# Patient Record
Sex: Male | Born: 1970 | Race: White | Hispanic: No | Marital: Married | Smoking: Former smoker
Health system: Southern US, Community
[De-identification: ages and names within clinical notes are randomized; demographics above are authoritative.]

## PROBLEM LIST (undated history)

## (undated) DIAGNOSIS — M1990 Unspecified osteoarthritis, unspecified site: Secondary | ICD-10-CM

## (undated) DIAGNOSIS — K219 Gastro-esophageal reflux disease without esophagitis: Secondary | ICD-10-CM

## (undated) HISTORY — PX: HERNIA REPAIR: SHX51

## (undated) HISTORY — DX: Unspecified osteoarthritis, unspecified site: M19.90

## (undated) HISTORY — DX: Gastro-esophageal reflux disease without esophagitis: K21.9

---

## 1998-03-30 ENCOUNTER — Emergency Department (HOSPITAL_COMMUNITY): Admission: EM | Admit: 1998-03-30 | Discharge: 1998-03-30 | Payer: Self-pay | Admitting: Emergency Medicine

## 2003-09-01 ENCOUNTER — Emergency Department (HOSPITAL_COMMUNITY): Admission: EM | Admit: 2003-09-01 | Discharge: 2003-09-01 | Payer: Self-pay | Admitting: Emergency Medicine

## 2007-10-09 ENCOUNTER — Inpatient Hospital Stay (HOSPITAL_COMMUNITY): Admission: EM | Admit: 2007-10-09 | Discharge: 2007-10-09 | Payer: Self-pay | Admitting: Emergency Medicine

## 2010-05-05 ENCOUNTER — Emergency Department (HOSPITAL_COMMUNITY): Admission: EM | Admit: 2010-05-05 | Discharge: 2010-05-05 | Payer: Self-pay | Admitting: Emergency Medicine

## 2010-08-25 ENCOUNTER — Encounter: Admission: RE | Admit: 2010-08-25 | Discharge: 2010-08-25 | Payer: Self-pay | Admitting: Sports Medicine

## 2010-12-08 LAB — POCT I-STAT, CHEM 8
BUN: 6 mg/dL (ref 6–23)
Calcium, Ion: 1.05 mmol/L — ABNORMAL LOW (ref 1.12–1.32)
Chloride: 111 meq/L (ref 96–112)
Creatinine, Ser: 1 mg/dL (ref 0.4–1.5)
Glucose, Bld: 102 mg/dL — ABNORMAL HIGH (ref 70–99)
HCT: 45 % (ref 39.0–52.0)
Hemoglobin: 15.3 g/dL (ref 13.0–17.0)
Potassium: 3.9 meq/L (ref 3.5–5.1)
Sodium: 145 meq/L (ref 135–145)
TCO2: 22 mmol/L (ref 0–100)

## 2010-12-08 LAB — COMPREHENSIVE METABOLIC PANEL WITH GFR
ALT: 18 U/L (ref 0–53)
AST: 23 U/L (ref 0–37)
Albumin: 4.2 g/dL (ref 3.5–5.2)
Alkaline Phosphatase: 42 U/L (ref 39–117)
BUN: 6 mg/dL (ref 6–23)
CO2: 23 meq/L (ref 19–32)
Calcium: 9.2 mg/dL (ref 8.4–10.5)
Chloride: 112 meq/L (ref 96–112)
Creatinine, Ser: 0.81 mg/dL (ref 0.4–1.5)
GFR calc Af Amer: >60 mL/min (ref >60)
GFR calc non Af Amer: >60 mL/min (ref >60)
Glucose, Bld: 104 mg/dL — ABNORMAL HIGH (ref 70–99)
Potassium: 3.7 meq/L (ref 3.5–5.1)
Sodium: 144 meq/L (ref 135–145)
Total Bilirubin: 0.4 mg/dL (ref 0.3–1.2)
Total Protein: 7.4 g/dL (ref 6.0–8.3)

## 2010-12-08 LAB — APTT: aPTT: 26 s (ref 24–37)

## 2010-12-08 LAB — PROTIME-INR
INR: 0.96 (ref 0.00–1.49)
Prothrombin Time: 13 s (ref 11.6–15.2)

## 2010-12-08 LAB — ABO/RH: ABO/RH(D): O POS

## 2010-12-08 LAB — CBC
HCT: 42.8 % (ref 39.0–52.0)
Hemoglobin: 14.9 g/dL (ref 13.0–17.0)
MCH: 33.3 pg (ref 26.0–34.0)
MCHC: 34.8 g/dL (ref 30.0–36.0)
MCV: 95.7 fL (ref 78.0–100.0)
Platelets: 191 K/uL (ref 150–400)
RBC: 4.47 MIL/uL (ref 4.22–5.81)
RDW: 12.8 % (ref 11.5–15.5)
WBC: 8.1 K/uL (ref 4.0–10.5)

## 2010-12-08 LAB — TYPE AND SCREEN
ABO/RH(D): O POS
Antibody Screen: NEGATIVE

## 2010-12-08 LAB — LACTIC ACID, PLASMA: Lactic Acid, Venous: 1.6 mmol/L (ref 0.5–2.2)

## 2010-12-08 LAB — ETHANOL: Alcohol, Ethyl (B): 229 mg/dL — ABNORMAL HIGH (ref 0–10)

## 2011-02-06 NOTE — Consult Note (Signed)
[name], [name]          ACCOUNT NO.:  [id number]   MEDICAL RECORD NO.:  [id number]          PATIENT TYPE:  INP   LOCATION:  3304                         FACILITY:  [facility]   PHYSICIAN:  Antonietta Breach, M.D.  DATE OF BIRTH:  04/09/1971   DATE OF CONSULTATION:  10/09/2007  DATE OF DISCHARGE:  10/09/2007                                 CONSULTATION   REQUESTING PHYSICIAN:  [facility].   REASON FOR CONSULTATION:  Substance abuse overdose.   [name] is a 40 year old male admitted to the [facility] on  October 08, 2007, after a drinking binge and an overdose.   The patient has been laid off from work.  He has been drinking alcohol  heavily.  The patient drank all day on the day prior to admission and  has a complete blackout for the events that followed, including when he  swallowed a full bottle of 90 tablets of Xanax 0.5 mg, the bottle had  just been filled on October 02, 2007.   The patient did have severe delirium during his intoxication and was  combative in the emergency room.   By the day of the undersigned's evaluation, the patient is calm.  He is  socially cooperative.  He is not having any thoughts of harming himself  or others.  He has no delusions or hallucinations.  He has constructive  future goals and intact interests.  He has appropriate level of regret  about his alcohol binge.  He realizes that the alcohol impaired his  judgment and increased his impulsivity.   PAST PSYCHIATRIC HISTORY:  The patient does have a history of alcohol  dependence and has undergone a 28-day Chemical Dependency Inpatient  Rehabilitation Program at [facility].   He has been prescribed Xanax for anxiety as an outpatient.   FAMILY PSYCHIATRIC HISTORY:  None known.   SOCIAL HISTORY:  He was recently laid off.  He has a girlfriend and they  live together.   MENTAL STATUS EXAM:  [name] is alert, oriented to all spheres.  Speech within normal  limits.  Affect broad and appropriate.  Thought  process logical, coherent, and goal-directed.  No looseness of  associations.  Thought content, no thoughts of harming himself, no  thoughts of harming others, no delusions, no hallucinations.  Judgment  is intact.,   ASSESSMENT:  AXIS I:  1. Alcohol dependence.  2. Adjustment disorder with mixed disturbance of emotions and conduct      resolved after recovery from substance intoxication.  3. 293.00, delirium due to substance intoxication, resolved.  AXIS II:  Deferred.  AXIS III:  History of seizure disorder, on Tegretol.  AXIS IV:  Occupational.  AXIS V:  55.   [name] is no longer at risk to harm himself or others now that he  has recovered from his intoxication.   He agrees to call emergency services immediately for any thoughts of  harming himself, thoughts of harming others, or distress.   The undersigned did recommend a Chemical Dependency Inpatient  Rehabilitation Program; however, the patient declined and he is no  longer committable  now that he has recovered from his intoxicated state.   The patient wants to proceed with outpatient.   There are Chemical Dependency Inpatient Rehabilitation Programs  available at [facility], [facility], and [facility].  Also, there is Alcohol Drug Services through the [facility].  The patient needs to also attend 12-step groups.      Antonietta Breach, M.D.  Electronically Signed     JW/MEDQ  D:  02/07/2008  T:  02/07/2008  Job:  [id number]

## 2011-02-06 NOTE — H&P (Signed)
NAMEKAI, CALICO          ACCOUNT NO.:  [id number]   MEDICAL RECORD NO.:  [id number]          PATIENT TYPE:  INP   LOCATION:  2101                         FACILITY:  [facility]   PHYSICIAN:  Mobolaji B. Bakare, M.D.DATE OF BIRTH:  [birth year]   DATE OF ADMISSION:  10/08/2007  DATE OF DISCHARGE:                              HISTORY & PHYSICAL   PRIMARY CARE PHYSICIAN:  Dr. Aida Puffer   CHIEF COMPLAINT:  Overdose and alcohol intoxication.   HISTORY OF PRESENT ILLNESS:  Mr. Wildes is a 40 year old Caucasian male  who has been undergoing some stressors recently.  He was laid off from  his job at [employer].  Since then, the patient has been drinking  heavily.  He was accompanied to the emergency room by his girlfriend.  Both of them live together.  The patient states that the patient had  been drinking all day yesterday, and about 6:30 p.m. he swallowed a full  bottle of Xanax 0.5 mg, 90 tablets, which was filled on October 02, 2007.  The patient was brought to the emergency room around 8:50 last night.  In route to the hospital, the patient was very combative.  EMS had to  give him Versed 6 mg in total.  Upon arrival, the patient was calmer.  He had a blood pressure of 96/73, pulse 103, respiratory rate 18.  He  was arousable.  He was given Romazicon by ER.  He woke up after the  second does but went back to sleep.  His alcohol level was 211.  The  patient was currently unable to give any history.   REVIEW OF SYSTEMS:  Unobtainable.   PAST MEDICAL HISTORY:  1. Seizure disorder.  2. Depression.   CURRENT MEDICATIONS:  1. Tegretol.  2. Xanax.   ALLERGIES:  No known drug allergies.   FAMILY HISTORY:  Father is alive.  He has diabetes mellitus and  hypertension.   SOCIAL HISTORY:  The patient is unemployed, recently laid off.  He  smokes about a pack a day and drinks alcohol heavy.   PHYSICAL EXAMINATION:  VITAL SIGNS:  Currently, blood pressure 105/70,  pulse 90,  respirations 18, O2 saturation 100% on oxygen.  NECK:  The patient is arousable minimally to sternal rub.  His pupils  are small, 2-3 mm, nonreactive.  He is not in any acute respiratory  distress.  He is snoring.  No elevated JVD.  No carotid bruits.  LUNGS:  Good air entry bilaterally.  CV:  S1, S2 regular.  No murmur.  ABDOMEN:  Soft, nontender, bowel sounds present.  EXTREMITIES:  No pedal edema, calf tenderness.  CNS:  The patient  moves all limbs to sternal rub.   LABORATORY DATA:  Initially, urine drug screen positive for  benzodiazepine.  Salicylate level less than 4.  Lipase 22, alcohol level  211.  Sodium 114, potassium 4.2, chloride 106, bicarbonate 29, glucose  88, BUN 88.  Creatinine 0.94, LFTs normal except for ALT 57.  Acetaminophen level less than 10.  PT 13.6, INR 1.6, CBC is normal.  Urinalysis is normal.  Chest x-ray showed no acute findings.  EKG sinus  tachycardia with heart rate of 105.   ASSESSMENT/PLAN:  1. Suicidal attempt.  2. Overdose on Xanax with altered mental status.  3. Altered mental status secondary to #2 and alcohol intoxication.  4. Alcohol intoxication.  5. Major depression.  6. Tobacco abuse.  7. History of seizures.   PLAN:  Admit to ICU for close monitoring, 24-hour sitter when awake,  thiamine 100 mg IV daily.  Folate 1 mg IV daily.  IV fluid normal saline  at 125 cc per hour.  The patient will be kept n.p.o.  He will be placed  on seizure precautions.  Psychiatry is to be consulted.  We will need to  resume Tegretol when the patient is awake.  Will do a urinary tract q.2  h.  If there is no improvement in his mental status in the next 6 hours,  we will obtain a head CT scan.      Mobolaji B. Corky Downs, M.D.  Electronically Signed     MBB/MEDQ  D:  10/09/2007  T:  10/09/2007  Job:  [id number]   cc:   Aida Puffer

## 2011-05-23 ENCOUNTER — Encounter (INDEPENDENT_AMBULATORY_CARE_PROVIDER_SITE_OTHER): Payer: Self-pay | Admitting: Surgery

## 2011-06-05 ENCOUNTER — Encounter (INDEPENDENT_AMBULATORY_CARE_PROVIDER_SITE_OTHER): Payer: Self-pay | Admitting: Surgery

## 2011-06-05 ENCOUNTER — Ambulatory Visit (INDEPENDENT_AMBULATORY_CARE_PROVIDER_SITE_OTHER): Payer: BC Managed Care – PPO | Admitting: Surgery

## 2011-06-05 VITALS — BP 112/74 | HR 72 | Temp 97.4°F | Ht 69.0 in | Wt 176.8 lb

## 2011-06-05 DIAGNOSIS — K4090 Unilateral inguinal hernia, without obstruction or gangrene, not specified as recurrent: Secondary | ICD-10-CM | POA: Insufficient documentation

## 2011-06-05 MED ORDER — OXYCODONE-ACETAMINOPHEN 5-325 MG PO TABS: 1.0000 | ORAL_TABLET | ORAL | Status: AC | PRN
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
## 2011-06-14 ENCOUNTER — Encounter (INDEPENDENT_AMBULATORY_CARE_PROVIDER_SITE_OTHER): Payer: Self-pay | Admitting: Surgery

## 2011-06-14 LAB — RAPID URINE DRUG SCREEN, HOSP PERFORMED
Amphetamines: NOT DETECTED
Barbiturates: NOT DETECTED
Benzodiazepines: POSITIVE — AB
Cocaine: NOT DETECTED
Opiates: NOT DETECTED
Tetrahydrocannabinol: NOT DETECTED

## 2011-06-14 LAB — ACETAMINOPHEN LEVEL: Acetaminophen (Tylenol), Serum: <10 — ABNORMAL LOW

## 2011-06-14 LAB — URINALYSIS, ROUTINE W REFLEX MICROSCOPIC
Bilirubin Urine: NEGATIVE
Glucose, UA: NEGATIVE
Hgb urine dipstick: NEGATIVE
Ketones, ur: NEGATIVE
Nitrite: NEGATIVE
Protein, ur: NEGATIVE
Specific Gravity, Urine: 1.01
Urobilinogen, UA: 0.2
pH: 6

## 2011-06-14 LAB — COMPREHENSIVE METABOLIC PANEL
AST: 33
Albumin: 4.2
Alkaline Phosphatase: 77
Chloride: 106
GFR calc Af Amer: >60
Potassium: 4.2
Total Bilirubin: 0.5
Total Protein: 7.1

## 2011-06-14 LAB — ETHANOL: Alcohol, Ethyl (B): 211 — ABNORMAL HIGH

## 2011-06-14 LAB — DIFFERENTIAL
Basophils Absolute: 0
Basophils Relative: 1
Eosinophils Relative: 5
Lymphocytes Relative: 35
Monocytes Absolute: 0.5
Monocytes Relative: 8

## 2011-06-14 LAB — SALICYLATE LEVEL: Salicylate Lvl: <4

## 2011-06-14 LAB — CBC
Platelets: 232
WBC: 6.3

## 2011-06-14 NOTE — Progress Notes (Signed)
I received a note from Dr. Jeannetta Nap showing that the patient has been getting frequent prescriptions for narcotics from his office.  I gave him a prescription for Percocet at the time of his consultation, but I think it is a bad idea to have a patient receiving narcotics from two different physicians.  Therefore, we will not prescribe any pain medication until the time of surgery, and will allow Dr. Jeannetta Nap to manage his preop symptoms.

## 2011-07-11 ENCOUNTER — Telehealth (INDEPENDENT_AMBULATORY_CARE_PROVIDER_SITE_OTHER): Payer: Self-pay | Admitting: General Surgery

## 2011-07-11 NOTE — Telephone Encounter (Signed)
Pt called today and wanted more pain meds. Pt was seen back on 9-11 for [facility] and Rx for percocet 5/325 was given. Pt wants another Rx for Percocet. Pt is aware that it may be this afternoon before I get a answer from the Dr

## 2011-07-11 NOTE — Telephone Encounter (Signed)
Please see my previous note from 9/20.  The patient has been trying to get narcotics from two different physicians.  He may only get narcotics from Dr. Jeannetta Nap.  He needs to go ahead and schedule his surgery.

## 2011-07-11 NOTE — Telephone Encounter (Signed)
Called and spoke to pt wife and told that we will not be giving any pain med at this time. Per Dr Corliss Skains

## 2011-07-26 ENCOUNTER — Encounter (HOSPITAL_COMMUNITY): Payer: BC Managed Care – PPO

## 2011-07-26 ENCOUNTER — Encounter (HOSPITAL_COMMUNITY): Payer: Self-pay

## 2011-07-26 DIAGNOSIS — K4090 Unilateral inguinal hernia, without obstruction or gangrene, not specified as recurrent: Secondary | ICD-10-CM

## 2011-07-26 LAB — CBC
Hemoglobin: 12.6 g/dL — ABNORMAL LOW (ref 13.0–17.0)
MCH: 31.9 pg (ref 26.0–34.0)
Platelets: 200 10*3/uL (ref 150–400)
RBC: 3.95 MIL/uL — ABNORMAL LOW (ref 4.22–5.81)
WBC: 4.3 10*3/uL (ref 4.0–10.5)

## 2011-07-26 LAB — SURGICAL PCR SCREEN: MRSA, PCR: NEGATIVE

## 2011-07-26 NOTE — Pre-Procedure Instructions (Signed)
07/26/11 labs okay for surgery per annie at office per Dr Corliss Skains.
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
## 2011-07-26 NOTE — Progress Notes (Signed)
Quick Note:  This patient may proceed with surgery ______ 

## 2011-07-27 NOTE — Progress Notes (Unsigned)
This encounter was created in error - please disregard.

## 2011-07-29 NOTE — Progress Notes (Signed)
I don't know how to close this encounter

## 2011-08-06 ENCOUNTER — Encounter (HOSPITAL_COMMUNITY)
Admission: RE | Disposition: A | Discharge status: Home / Self Care | Payer: Self-pay | Source: Ambulatory Visit | Attending: Surgery

## 2011-08-06 ENCOUNTER — Encounter (HOSPITAL_COMMUNITY): Payer: Self-pay | Admitting: *Deleted

## 2011-08-06 ENCOUNTER — Ambulatory Visit (HOSPITAL_COMMUNITY)
Admission: RE | Admit: 2011-08-06 | Discharge: 2011-08-06 | Disposition: A | Discharge status: Home / Self Care | Payer: BC Managed Care – PPO | Source: Ambulatory Visit | Attending: Surgery | Admitting: Surgery

## 2011-08-06 ENCOUNTER — Ambulatory Visit (HOSPITAL_COMMUNITY): Payer: BC Managed Care – PPO | Admitting: Anesthesiology

## 2011-08-06 ENCOUNTER — Encounter (HOSPITAL_COMMUNITY): Payer: Self-pay | Admitting: Anesthesiology

## 2011-08-06 DIAGNOSIS — K4090 Unilateral inguinal hernia, without obstruction or gangrene, not specified as recurrent: Secondary | ICD-10-CM | POA: Insufficient documentation

## 2011-08-06 DIAGNOSIS — Z79899 Other long term (current) drug therapy: Secondary | ICD-10-CM | POA: Insufficient documentation

## 2011-08-06 DIAGNOSIS — F411 Generalized anxiety disorder: Secondary | ICD-10-CM | POA: Insufficient documentation

## 2011-08-06 DIAGNOSIS — K219 Gastro-esophageal reflux disease without esophagitis: Secondary | ICD-10-CM | POA: Insufficient documentation

## 2011-08-06 DIAGNOSIS — M129 Arthropathy, unspecified: Secondary | ICD-10-CM | POA: Insufficient documentation

## 2011-08-06 HISTORY — PX: INGUINAL HERNIA REPAIR: SHX194

## 2011-08-06 SURGERY — REPAIR, HERNIA, INGUINAL, ADULT
Anesthesia: General | Site: Pelvis | Laterality: Right | Wound class: Clean

## 2011-08-06 MED ORDER — DEXAMETHASONE SODIUM PHOSPHATE 10 MG/ML IJ SOLN
INTRAMUSCULAR | Status: DC | PRN
  Administered 2011-08-06: 10 mg via INTRAVENOUS

## 2011-08-06 MED ORDER — HYDROMORPHONE HCL PF 1 MG/ML IJ SOLN: 1.0000 mg | INTRAMUSCULAR | Status: DC | PRN

## 2011-08-06 MED ORDER — BUPIVACAINE-EPINEPHRINE 0.25% -1:200000 IJ SOLN
INTRAMUSCULAR | Status: AC
  Filled 2011-08-06: qty 1

## 2011-08-06 MED ORDER — BUPIVACAINE LIPOSOME 1.3 % IJ SUSP
20.0000 mL | Freq: Once | INTRAMUSCULAR | Status: AC
  Administered 2011-08-06: 20 mL
  Filled 2011-08-06: qty 20

## 2011-08-06 MED ORDER — FENTANYL CITRATE 0.05 MG/ML IJ SOLN
INTRAMUSCULAR | Status: AC
  Filled 2011-08-06: qty 2

## 2011-08-06 MED ORDER — FENTANYL CITRATE 0.05 MG/ML IJ SOLN
25.0000 ug | INTRAMUSCULAR | Status: DC | PRN
  Administered 2011-08-06: 50 ug via INTRAVENOUS

## 2011-08-06 MED ORDER — CEFAZOLIN SODIUM 1-5 GM-% IV SOLN
1.0000 g | Freq: Once | INTRAVENOUS | Status: AC
  Administered 2011-08-06: 1 g via INTRAVENOUS
  Filled 2011-08-06: qty 50

## 2011-08-06 MED ORDER — OXYCODONE-ACETAMINOPHEN 5-325 MG PO TABS
ORAL_TABLET | ORAL | Status: AC
  Administered 2011-08-06: 1 via ORAL
  Filled 2011-08-06: qty 1

## 2011-08-06 MED ORDER — ONDANSETRON HCL 4 MG/2ML IJ SOLN
INTRAMUSCULAR | Status: DC | PRN
  Administered 2011-08-06: 4 mg via INTRAVENOUS

## 2011-08-06 MED ORDER — HYDROMORPHONE HCL PF 2 MG/ML IJ SOLN
INTRAMUSCULAR | Status: AC
  Administered 2011-08-06: 1 mg via INTRAVENOUS
  Filled 2011-08-06: qty 1

## 2011-08-06 MED ORDER — KETOROLAC TROMETHAMINE 30 MG/ML IJ SOLN: 15.0000 mg | Freq: Once | INTRAMUSCULAR | Status: DC | PRN

## 2011-08-06 MED ORDER — FENTANYL CITRATE 0.05 MG/ML IJ SOLN
25.0000 ug | INTRAMUSCULAR | Status: DC | PRN
  Administered 2011-08-06 (×2): 50 ug via INTRAVENOUS

## 2011-08-06 MED ORDER — ONDANSETRON HCL 4 MG/2ML IJ SOLN: 4.0000 mg | INTRAMUSCULAR | Status: DC | PRN

## 2011-08-06 MED ORDER — OXYCODONE-ACETAMINOPHEN 5-325 MG PO TABS: 1.0000 | ORAL_TABLET | ORAL | Status: DC | PRN

## 2011-08-06 MED ORDER — PROPOFOL 10 MG/ML IV EMUL
INTRAVENOUS | Status: DC | PRN
  Administered 2011-08-06: 200 mg via INTRAVENOUS

## 2011-08-06 MED ORDER — FENTANYL CITRATE 0.05 MG/ML IJ SOLN
INTRAMUSCULAR | Status: DC | PRN
  Administered 2011-08-06: 100 ug via INTRAVENOUS
  Administered 2011-08-06 (×3): 25 ug via INTRAVENOUS
  Administered 2011-08-06: 50 ug via INTRAVENOUS
  Administered 2011-08-06: 25 ug via INTRAVENOUS

## 2011-08-06 MED ORDER — FENTANYL CITRATE 0.05 MG/ML IJ SOLN
INTRAMUSCULAR | Status: AC
  Administered 2011-08-06: 50 ug via INTRAVENOUS
  Filled 2011-08-06: qty 2

## 2011-08-06 MED ORDER — MEPERIDINE HCL 25 MG/ML IJ SOLN: 6.2500 mg | INTRAMUSCULAR | Status: DC | PRN

## 2011-08-06 MED ORDER — ACETAMINOPHEN 10 MG/ML IV SOLN
INTRAVENOUS | Status: AC
  Filled 2011-08-06: qty 100

## 2011-08-06 MED ORDER — SODIUM CHLORIDE 0.9 % IR SOLN
Status: DC | PRN
  Administered 2011-08-06: 1000 mL

## 2011-08-06 MED ORDER — ACETAMINOPHEN 10 MG/ML IV SOLN
INTRAVENOUS | Status: DC | PRN
  Administered 2011-08-06: 1000 mg via INTRAVENOUS

## 2011-08-06 MED ORDER — OXYCODONE-ACETAMINOPHEN 5-325 MG PO TABS: 1.0000 | ORAL_TABLET | ORAL | Status: AC | PRN

## 2011-08-06 MED ORDER — KETOROLAC TROMETHAMINE 60 MG/2ML IM SOLN
INTRAMUSCULAR | Status: DC | PRN
  Administered 2011-08-06: 30 mg via INTRAMUSCULAR

## 2011-08-06 MED ORDER — LACTATED RINGERS IV SOLN
INTRAVENOUS | Status: DC | PRN
  Administered 2011-08-06: 07:00:00 via INTRAVENOUS

## 2011-08-06 MED ORDER — MIDAZOLAM HCL 5 MG/5ML IJ SOLN
INTRAMUSCULAR | Status: DC | PRN
  Administered 2011-08-06: 2 mg via INTRAVENOUS

## 2011-08-06 SURGICAL SUPPLY — 46 items
APL SKNCLS STERI-STRIP NONHPOA (GAUZE/BANDAGES/DRESSINGS) ×1
BENZOIN TINCTURE PRP APPL 2/3 (GAUZE/BANDAGES/DRESSINGS) ×2 IMPLANT
BLADE HEX COATED 2.75 (ELECTRODE) ×2 IMPLANT
CHLORAPREP W/TINT 26ML (MISCELLANEOUS) ×2 IMPLANT
CLOSURE STERI STRIP 1/2 X4 (GAUZE/BANDAGES/DRESSINGS) ×1 IMPLANT
CLOTH BEACON ORANGE TIMEOUT ST (SAFETY) ×2 IMPLANT
COVER SURGICAL LIGHT HANDLE (MISCELLANEOUS) ×2 IMPLANT
DECANTER SPIKE VIAL GLASS SM (MISCELLANEOUS) ×2 IMPLANT
DISSECTOR ROUND CHERRY 3/8 STR (MISCELLANEOUS) ×2 IMPLANT
DRAIN PENROSE 18X1/2 LTX STRL (DRAIN) ×1 IMPLANT
DRAPE LAPAROTOMY TRNSV 102X78 (DRAPE) ×2 IMPLANT
DRAPE UTILITY XL STRL (DRAPES) ×2 IMPLANT
DRSG OPSITE 4X5.5 SM (GAUZE/BANDAGES/DRESSINGS) ×1 IMPLANT
DRSG TEGADERM 4X4.75 (GAUZE/BANDAGES/DRESSINGS) IMPLANT
ELECT REM PT RETURN 9FT ADLT (ELECTROSURGICAL) ×2
ELECTRODE REM PT RTRN 9FT ADLT (ELECTROSURGICAL) ×1 IMPLANT
GAUZE SPONGE 4X4 16PLY XRAY LF (GAUZE/BANDAGES/DRESSINGS) IMPLANT
GLOVE BIO SURGEON STRL SZ7 (GLOVE) ×2 IMPLANT
GLOVE BIOGEL PI IND STRL 7.5 (GLOVE) ×1 IMPLANT
GLOVE BIOGEL PI INDICATOR 7.5 (GLOVE) ×1
GOWN STRL NON-REIN LRG LVL3 (GOWN DISPOSABLE) ×4 IMPLANT
GOWN STRL REIN XL XLG (GOWN DISPOSABLE) ×2 IMPLANT
KIT BASIN OR (CUSTOM PROCEDURE TRAY) ×2 IMPLANT
MESH ULTRAPRO 3X6 7.6X15CM (Mesh General) ×1 IMPLANT
NDL HYPO 25X1 1.5 SAFETY (NEEDLE) ×1 IMPLANT
NEEDLE HYPO 25X1 1.5 SAFETY (NEEDLE) ×2 IMPLANT
NS IRRIG 1000ML POUR BTL (IV SOLUTION) ×2 IMPLANT
PACK GENERAL/GYN (CUSTOM PROCEDURE TRAY) ×2 IMPLANT
SPONGE GAUZE 4X4 12PLY (GAUZE/BANDAGES/DRESSINGS) ×2 IMPLANT
STRIP CLOSURE SKIN 1/2X4 (GAUZE/BANDAGES/DRESSINGS) ×2 IMPLANT
SUT MNCRL AB 4-0 PS2 18 (SUTURE) ×2 IMPLANT
SUT PROLENE 2 0 SH DA (SUTURE) ×2 IMPLANT
SUT SILK 2 0 SH (SUTURE) IMPLANT
SUT SILK 3 0 (SUTURE)
SUT SILK 3-0 18XBRD TIE 12 (SUTURE) IMPLANT
SUT VIC AB 2-0 CT2 27 (SUTURE) IMPLANT
SUT VIC AB 2-0 SH 27 (SUTURE) ×2
SUT VIC AB 2-0 SH 27X BRD (SUTURE) ×1 IMPLANT
SUT VIC AB 3-0 SH 27 (SUTURE) ×2
SUT VIC AB 3-0 SH 27X BRD (SUTURE) IMPLANT
SUT VIC AB 3-0 SH 27XBRD (SUTURE) IMPLANT
SUT VICRYL 0 27 CT2 27 ABS (SUTURE) IMPLANT
SUT VICRYL 0 UR6 27IN ABS (SUTURE) ×1 IMPLANT
SYR CONTROL 10ML LL (SYRINGE) ×2 IMPLANT
TAPE HYPAFIX 4 X10 (GAUZE/BANDAGES/DRESSINGS) ×1 IMPLANT
TOWEL OR 17X26 10 PK STRL BLUE (TOWEL DISPOSABLE) ×4 IMPLANT

## 2011-08-06 NOTE — Preoperative (Signed)
Beta Blockers   Reason not to administer Beta Blockers:Not Applicable 

## 2011-08-06 NOTE — Progress Notes (Signed)
Pt ambulated in hall to BR.  Pt tolerated well.  Pt was able to void moderate amount of clear yellow urine.
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
## 2011-08-06 NOTE — Anesthesia Preprocedure Evaluation (Signed)
Anesthesia Evaluation  Patient identified by MRN, date of birth, ID band Patient awake    Reviewed: Allergy & Precautions, H&P , NPO status , Patient's Chart, lab work & pertinent test results  Airway       Dental   Pulmonary neg pulmonary ROS, Current Smoker,          Cardiovascular neg cardio ROS     Neuro/Psych Negative Neurological ROS  Negative Psych ROS   GI/Hepatic negative GI ROS, Neg liver ROS,   Endo/Other  Negative Endocrine ROS  Renal/GU negative Renal ROS  Genitourinary negative   Musculoskeletal negative musculoskeletal ROS (+)   Abdominal   Peds negative pediatric ROS (+)  Hematology negative hematology ROS (+)   Anesthesia Other Findings   Reproductive/Obstetrics negative OB ROS                           Anesthesia Physical Anesthesia Plan  ASA: II  Anesthesia Plan: General   Post-op Pain Management:    Induction: Intravenous  Airway Management Planned: LMA and Oral ETT  Additional Equipment:   Intra-op Plan:   Post-operative Plan: Extubation in OR  Informed Consent: I have reviewed the patients History and Physical, chart, labs and discussed the procedure including the risks, benefits and alternatives for the proposed anesthesia with the patient or authorized representative who has indicated his/her understanding and acceptance.   Dental advisory given  Plan Discussed with: CRNA  Anesthesia Plan Comments:         Anesthesia Quick Evaluation

## 2011-08-06 NOTE — Op Note (Signed)
Hernia, Open, Procedure Note  Indications: The patient presented with a history of a right, reducible hernia.    Pre-operative Diagnosis: right reducible  Post-operative Diagnosis: same  Surgeon: Wynona Luna.   Assistants: none  Anesthesia: General LMA anesthesia  ASA Class: 2  Procedure Details  The patient was seen again in the Holding Room. The risks, benefits, complications, treatment options, and expected outcomes were discussed with the patient. The possibilities of reaction to medication, pulmonary aspiration, perforation of viscus, bleeding, recurrent infection, the need for additional procedures, and development of a complication requiring transfusion or further operation were discussed with the patient and/or family. The likelihood of success in repairing the hernia and returning the patient to their previous functional status is good.  There was concurrence with the proposed plan, and informed consent was obtained. The site of surgery was properly noted/marked. The patient was taken to the Operating Room, identified as [name], and the procedure verified as right inguinal hernia repair. A Time Out was held and the above information confirmed.  The patient was placed in the supine position and underwent induction of anesthesia. The lower abdomen and groin was prepped with Chloraprep and draped in the standard fashion, and 0.5% Marcaine with epinephrine was used to anesthetize the skin over the mid-portion of the inguinal canal. An oblique incision was made. Dissection was carried down through the subcutaneous tissue with cautery to the external oblique fascia. The external oblique fascia appeared to have a longitudinal split. We opened the external oblique fascia along the direction of its fibers to the external ring.  The spermatic cord was circumferentially dissected bluntly and retracted with a Penrose drain.  The floor of the inguinal canal was inspected and was  noted to have a direct hernia defect.  We closed the floor of the inguinal canal with 0 Vicryl.  We skeletonized the spermatic cord and did not find an indirect hernia sac..  We used a 3 x 6 inch piece of Ultrapro mesh, which was cut into a keyhole shape.  This was secured with 2-0 Prolene, beginning at the pubic tubercle, running this along the internal oblique fascia superiorly and the shelving edge inferiorly.  The tails of the mesh were sutured together behind the spermatic cord.  The mesh was tucked underneath the external oblique fascia laterally.  The external oblique fascia was reapproximated with 2-0 Vicryl.  3-0 Vicryl was used to close the subcutaneous tissues and 4-0 Monocryl was used to close the skin in subcuticular fashion.  Benzoin and steri-strips were used to seal the incision.  A clean dressing was applied.  The patient was then extubated and brought to the recovery room in stable condition.  All sponge, instrument, and needle counts were correct prior to closure and at the conclusion of the case.   Estimated Blood Loss: Minimal                 Complications: None; patient tolerated the procedure well.         Disposition: PACU - hemodynamically stable.         Condition: stable

## 2011-08-06 NOTE — Anesthesia Postprocedure Evaluation (Signed)
  Anesthesia Post-op Note  Patient: [name]  Procedure(s) Performed:  HERNIA REPAIR INGUINAL ADULT  Patient Location: PACU  Anesthesia Type: General  Level of Consciousness: awake and alert   Airway and Oxygen Therapy: Patient Spontanous Breathing  Post-op Pain: mild  Post-op Assessment: Post-op Vital signs reviewed, Patient's Cardiovascular Status Stable, Respiratory Function Stable, Patent Airway and No signs of Nausea or vomiting  Post-op Vital Signs: stable  Complications: No apparent anesthesia complications

## 2011-08-06 NOTE — Transfer of Care (Signed)
Immediate Anesthesia Transfer of Care Note  Patient: [name]  Procedure(s) Performed:  HERNIA REPAIR INGUINAL ADULT  Patient Location: PACU  Anesthesia Type: General  Level of Consciousness: awake, alert , oriented and patient cooperative  Airway & Oxygen Therapy: Patient Spontanous Breathing and Patient connected to face mask oxygen  Post-op Assessment: Report given to PACU RN and Post -op Vital signs reviewed and stable  Post vital signs: Reviewed and stable  Complications: No apparent anesthesia complications

## 2011-08-09 ENCOUNTER — Telehealth (INDEPENDENT_AMBULATORY_CARE_PROVIDER_SITE_OTHER): Payer: Self-pay

## 2011-08-09 ENCOUNTER — Encounter (HOSPITAL_COMMUNITY): Payer: Self-pay | Admitting: Surgery

## 2011-08-09 DIAGNOSIS — Z8719 Personal history of other diseases of the digestive system: Secondary | ICD-10-CM

## 2011-08-09 MED ORDER — HYDROCODONE-ACETAMINOPHEN 5-325 MG PO TABS: 1.0000 | ORAL_TABLET | Freq: Four times a day (QID) | ORAL | Status: AC | PRN

## 2011-08-09 NOTE — Telephone Encounter (Signed)
Pt called in requesting refill on pain med.b/c just had hernia repair on 08-06-11 by Dr Corliss Skains. I advised pt that I could call in an automatic refill for his pain med but we would switch it to Hydrocodone 5/325mg  #30 phoned to [place] [phone]/ [facility]

## 2011-08-21 ENCOUNTER — Encounter (INDEPENDENT_AMBULATORY_CARE_PROVIDER_SITE_OTHER): Payer: Self-pay | Admitting: Surgery

## 2011-08-21 ENCOUNTER — Encounter (INDEPENDENT_AMBULATORY_CARE_PROVIDER_SITE_OTHER): Payer: BC Managed Care – PPO | Admitting: Surgery

## 2011-08-21 ENCOUNTER — Ambulatory Visit (INDEPENDENT_AMBULATORY_CARE_PROVIDER_SITE_OTHER): Payer: BC Managed Care – PPO | Admitting: Surgery

## 2011-08-21 VITALS — BP 118/76 | HR 68 | Temp 98.9°F | Resp 18 | Ht 69.0 in | Wt 172.4 lb

## 2011-08-21 DIAGNOSIS — K4090 Unilateral inguinal hernia, without obstruction or gangrene, not specified as recurrent: Secondary | ICD-10-CM

## 2011-08-21 NOTE — Progress Notes (Signed)
Status post open repair of a right inguinal hernia on 11/12. He had a direct hernia as well as a tear in his external oblique fascia. Postoperatively, he did have a lot of coughing due to his cigarette smoking. He developed some swelling which appeared to be hematoma. He did have some bloody drainage from one end of his incision but he failed call the office. This has resolved. He still has a little bit of residual hematoma under his incision. No sign of infection. No sign of recurrent hernia. He is wanting to return to work one month postop which may be a little bit early considering his work activity. He told me that he will use a crane or will get help for any heavy lifting.  Followup p.r.n.  Daniel Laidlaw K.

## 2011-08-21 NOTE — Patient Instructions (Signed)
Slowly increase your level of activity.   

## 2011-09-19 ENCOUNTER — Encounter (INDEPENDENT_AMBULATORY_CARE_PROVIDER_SITE_OTHER): Payer: Self-pay | Admitting: General Surgery

## 2011-09-19 ENCOUNTER — Telehealth (INDEPENDENT_AMBULATORY_CARE_PROVIDER_SITE_OTHER): Payer: Self-pay | Admitting: Surgery

## 2011-09-26 ENCOUNTER — Telehealth (INDEPENDENT_AMBULATORY_CARE_PROVIDER_SITE_OTHER): Payer: Self-pay

## 2011-09-26 NOTE — Telephone Encounter (Signed)
Pt called to request his RTW form be signed today and faxed to his employer.  Dr. Corliss Skains is unavailable today, so I requested the [facility] doctor (Dr. Carolynne Edouard) sign the form, and then have it faxed to his employer.  Dr. Billey Chang nurse, Kendell Bane, is aware.

## 2012-03-14 ENCOUNTER — Ambulatory Visit (INDEPENDENT_AMBULATORY_CARE_PROVIDER_SITE_OTHER): Payer: BC Managed Care – PPO | Admitting: Surgery

## 2012-03-14 ENCOUNTER — Encounter (INDEPENDENT_AMBULATORY_CARE_PROVIDER_SITE_OTHER): Payer: Self-pay | Admitting: Surgery

## 2012-03-14 VITALS — BP 120/82 | HR 84 | Resp 16 | Ht 69.5 in | Wt 160.0 lb

## 2012-03-14 DIAGNOSIS — R1031 Right lower quadrant pain: Secondary | ICD-10-CM

## 2012-03-15 ENCOUNTER — Encounter (INDEPENDENT_AMBULATORY_CARE_PROVIDER_SITE_OTHER): Payer: Self-pay | Admitting: Surgery

## 2012-03-15 NOTE — Progress Notes (Signed)
S/p right inguinal hernia repair with mesh on 06/18/11.  The patient had been doing well.  About a month ago, he felt some pain in his right groin.  He denies any swelling.    No swelling noted in the right groin.  Tender at the pubic tubercle.  No sign of recurrent hernia with Valsalva maneuver.    This pain likely represents some strain where the mesh is attached to the pubic tubercle with Prolene suture.  No recurrent hernia.  This will likely take a few weeks to resolve, but he may use NSAID's to help with the discomfort.  Follow-up PRN.  [name]. Corliss Skains, MD, [facility]  03/15/2012 1:11 PM

## 2012-08-05 ENCOUNTER — Encounter (INDEPENDENT_AMBULATORY_CARE_PROVIDER_SITE_OTHER): Payer: Self-pay | Admitting: Surgery

## 2012-08-05 ENCOUNTER — Ambulatory Visit (INDEPENDENT_AMBULATORY_CARE_PROVIDER_SITE_OTHER): Payer: BC Managed Care – PPO | Admitting: Surgery

## 2012-08-05 VITALS — BP 116/78 | HR 83 | Temp 99.8°F | Ht 69.0 in | Wt 163.8 lb

## 2012-08-05 DIAGNOSIS — M545 Low back pain, unspecified: Secondary | ICD-10-CM

## 2012-08-05 MED ORDER — METHYLPREDNISOLONE (PAK) 4 MG PO TABS: 4.0000 mg | ORAL_TABLET | Freq: Every day | ORAL | Status: AC

## 2012-08-05 NOTE — Progress Notes (Signed)
Patient ID: [name], male   DOB: 03-25-1971, 41 y.o.   MRN: [id number]  Chief Complaint  Patient presents with  . Follow-up    reck RIH    HPI [name] is a 41 y.o. male.  HPI This patient is one-year status post right inguinal hernia repair with mesh. He does not seem to be having any right groin pain. The patient has been having a lot of problems with his lower back in the lumbosacral region. He developed severe pain there that causes pain to radiate around to the base of the scrotum on the left. There was some difficulty in diagnosing his previous right inguinal hernia and he is concerned that he has a left inguinal hernia that is causing atypical symptoms. He did have MRI of the spine last year which was normal. He denies any numbness radiating down his leg. He is not feeling a bulge in his left groin.  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 This pain in his back has been interfering with his ability to work. He has stopped working out.  Past Medical History  Diagnosis Date  . GERD (gastroesophageal reflux disease)     percocet causes reflux issues   . Arthritis     hands     Past Surgical History  Procedure Date  . Inguinal hernia repair 08/06/2011    Procedure: HERNIA REPAIR INGUINAL ADULT;  Surgeon: Wilmon Arms. Corliss Skains, MD;  Location: [facility];  Service: General;  Laterality: Right;  . Hernia repair     History reviewed. No pertinent family history.  Social History History  Substance Use Topics  . Smoking status: Current Every Day Smoker -- 1.0 packs/day for 20 years  . Smokeless tobacco: Former Neurosurgeon  . Alcohol Use: No    No Known Allergies  Current Outpatient Prescriptions  Medication Sig Dispense Refill  . gabapentin (NEURONTIN) 100 MG capsule Take 400 mg by mouth 4 (four) times daily.       Marland Kitchen ibuprofen (ADVIL,MOTRIN) 800 MG tablet Take 800 mg by mouth every 8 (eight) hours as needed. For  hernia pain      . methylPREDNIsolone (MEDROL DOSPACK) 4 MG tablet Take 1 tablet (4 mg total) by mouth daily. follow package directions  21 tablet  0    Review of Systems Review of Systems  Constitutional: Negative for fever, chills and unexpected weight change.  HENT: Negative for hearing loss, congestion, sore throat, trouble swallowing and voice change.   Eyes: Negative for visual disturbance.  Respiratory: Negative for cough and wheezing.   Cardiovascular: Negative for chest pain, palpitations and leg swelling.  Gastrointestinal: Negative for nausea, vomiting, abdominal pain, diarrhea, constipation, blood in stool, abdominal distention, anal bleeding and rectal pain.  Genitourinary: Negative for hematuria and difficulty urinating.  Musculoskeletal: Positive for back pain and arthralgias.  Skin: Negative for rash and wound.  Neurological: Negative for seizures, syncope, weakness and headaches.  Hematological: Negative for adenopathy. Does not bruise/bleed easily.  Psychiatric/Behavioral: Negative for confusion.    Blood pressure 116/78, pulse 83, temperature 99.8 F (37.7 C), temperature source Temporal, height 5\' 9"  (1.753 m), weight 163 lb 12.8 oz (74.299 kg), SpO2 98.00%.  Physical Exam Physical Exam WDWN in NAD HEENT:  EOMI, sclera anicteric Neck:  No masses, no thyromegaly Lungs:  CTA bilaterally; normal respiratory effort CV:  Regular rate and rhythm; no murmurs Abd:  +bowel sounds, soft, non-tender, no masses GU:  Bilateral descended testes; no testicular masses; healed right inguinal incision - no sign of right inguinal hernia; no obvious left inguinal hernia on Valsalva Back - tender in lumbosacral region Ext:  Well-perfused; no edema Skin:  Warm, dry; no sign of jaundice Data Reviewed Clinical Data: Low back and right hip and leg pain.  MRI LUMBAR SPINE WITHOUT CONTRAST  Technique: Multiplanar and multiecho pulse sequences of the lumbar  spine were obtained without  intravenous contrast.  Comparison: None  Findings: The lumbar vertebral bodies are normally aligned. They  demonstrate normal marrow signal. The intervertebral discs are  maintained with normal T2 signal intensity. The last full  intervertebral disc space is labeled L5-S1 and the conus medullaris  terminates at L1.  The facets are normally aligned. No pars defects.  No significant retroperitoneal findings.  No focal disc protrusions, spinal or foraminal stenosis in the  lumbar spine.  IMPRESSION:  Unremarkable lumbar spine MRI examination.  Provider: Adin Hector   Assessment    Lumbosacral pain radiating down to his left groin No obvious left inguinal hernia on examination    Plan    The patient is hesitant to consent to any type of scan at this time due to financial reasons.  We will try an empiric course of a medrol dose pack to see if this improves his symptoms.  Recheck in 2-3 weeks.  If no improvement, we will reexamine him to see if he has a left inguinal hernia.  He agrees with this plan.       [name] K. 08/05/2012, 1:11 PM

## 2012-08-18 ENCOUNTER — Telehealth (INDEPENDENT_AMBULATORY_CARE_PROVIDER_SITE_OTHER): Payer: Self-pay | Admitting: General Surgery

## 2012-08-18 NOTE — Telephone Encounter (Signed)
Pt calling to ask for pain meds.  He has finished his pred-pack and is taking 3-4 Aleve at a time.  Strongly advised not to take that much Aleve to prevent damage to his liver or kidneys; should not take by 2 pill Q12H.  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  Has appt on 08/20/12 at 2:00 previously scheduled.  Please advise

## 2012-08-18 NOTE — Telephone Encounter (Signed)
Called and told patient wife that I will call in vicodin and she told me that I can call it into [facility]

## 2012-08-18 NOTE — Telephone Encounter (Signed)
Called in vicodin 5/325 into [facility] [phone]. Ok per Dr Corliss Skains

## 2012-08-20 ENCOUNTER — Ambulatory Visit (INDEPENDENT_AMBULATORY_CARE_PROVIDER_SITE_OTHER): Payer: BC Managed Care – PPO | Admitting: Surgery

## 2012-08-20 ENCOUNTER — Encounter (INDEPENDENT_AMBULATORY_CARE_PROVIDER_SITE_OTHER): Payer: Self-pay | Admitting: Surgery

## 2012-08-20 VITALS — BP 111/68 | HR 72 | Temp 99.0°F | Resp 16 | Ht 69.0 in | Wt 158.6 lb

## 2012-08-20 DIAGNOSIS — R1031 Right lower quadrant pain: Secondary | ICD-10-CM

## 2012-08-20 MED ORDER — OXYCODONE-ACETAMINOPHEN 5-325 MG PO TABS: 1.0000 | ORAL_TABLET | ORAL | Status: DC | PRN

## 2012-08-20 NOTE — Progress Notes (Signed)
The patient seems to have had no improvement after the steroid dose pack. He continues to have pain in his groin. The back pain persists and seems to radiate around to the base of the scrotum. The pain today seems to be more on his right side whereas last time he was hurting more in his left.  He still does not see a bulge on either side.  No sign of inguinal hernia on physical examination. He is tender right at the pubic tubercle on the right. Mild pubic tubercle tenderness on the left.  I think the next step would be an MRI to rule out a small occult recurrence of his hernia versus inflammation at the muscle insertion. If there is no sign of a surgical problem then he may require referral to pain management for injections. We will obtain the pelvic MRI as it is possible.  [name]. Corliss Skains, MD, [facility]  08/20/2012 5:56 PM

## 2012-08-27 ENCOUNTER — Ambulatory Visit
Admission: RE | Admit: 2012-08-27 | Discharge: 2012-08-27 | Disposition: A | Discharge status: Home / Self Care | Payer: BC Managed Care – PPO | Source: Ambulatory Visit | Attending: Surgery | Admitting: Surgery

## 2012-08-27 DIAGNOSIS — R1031 Right lower quadrant pain: Secondary | ICD-10-CM

## 2012-08-27 MED ORDER — GADOBENATE DIMEGLUMINE 529 MG/ML IV SOLN
15.0000 mL | Freq: Once | INTRAVENOUS | Status: AC | PRN
  Administered 2012-08-27: 15 mL via INTRAVENOUS

## 2012-08-29 ENCOUNTER — Telehealth (INDEPENDENT_AMBULATORY_CARE_PROVIDER_SITE_OTHER): Payer: Self-pay | Admitting: General Surgery

## 2012-08-29 ENCOUNTER — Other Ambulatory Visit (INDEPENDENT_AMBULATORY_CARE_PROVIDER_SITE_OTHER): Payer: Self-pay | Admitting: Surgery

## 2012-08-29 DIAGNOSIS — R1031 Right lower quadrant pain: Secondary | ICD-10-CM

## 2012-08-29 NOTE — Progress Notes (Signed)
Pelvic MRI normal.  Will contact patient and refer to pain management.  [name]. Corliss Skains, MD, [facility]  08/29/2012 9:20 AM

## 2012-08-29 NOTE — Telephone Encounter (Signed)
Called [name] and told him that Dr Corliss Skains went over his Daniel and it came back normal and now the next it going for him be refer to a Pain Clinic and I told patient that I will fax everything over and they will give him a call. Referral to [facility]

## 2012-09-09 ENCOUNTER — Telehealth (INDEPENDENT_AMBULATORY_CARE_PROVIDER_SITE_OTHER): Payer: Self-pay

## 2012-09-09 ENCOUNTER — Telehealth (INDEPENDENT_AMBULATORY_CARE_PROVIDER_SITE_OTHER): Payer: Self-pay | Admitting: General Surgery

## 2012-09-09 NOTE — Telephone Encounter (Signed)
Patient called to let our office know that he spoke to [facility] @ [facility] and the first availabale appointment is in January.  Patient states he's out of pain medication.  Patient states he was told by Victorino Dike @ [facility] that we would need to call and request an earlier appointment if needed.  Patient concerned because he will be out of pain medication until January

## 2012-09-09 NOTE — Telephone Encounter (Signed)
I called Victorino Dike at [facility] and she stated that she did not tell the patient to have me call over there to see if I could moved his apt up. Victorino Dike told me that she will make the apt for patient to be seen in late January and she  will call the patient the apt

## 2012-09-09 NOTE — Telephone Encounter (Signed)
Pt calling to ask if Dr. Corliss Skains will be able to give him pain medicine until he can be seen at the [facility] in late January.  Explained that since he had called earlier in the day, we have not had a answer.

## 2012-09-10 ENCOUNTER — Other Ambulatory Visit (INDEPENDENT_AMBULATORY_CARE_PROVIDER_SITE_OTHER): Payer: Self-pay | Admitting: Surgery

## 2012-09-10 ENCOUNTER — Telehealth (INDEPENDENT_AMBULATORY_CARE_PROVIDER_SITE_OTHER): Payer: Self-pay | Admitting: General Surgery

## 2012-09-10 MED ORDER — OXYCODONE-ACETAMINOPHEN 5-325 MG PO TABS: 1.0000 | ORAL_TABLET | ORAL | Status: DC | PRN

## 2012-09-10 NOTE — Telephone Encounter (Signed)
I left a message on pt's # [phone] that his prescription is written and ready for pick-up at front desk/gy

## 2012-09-10 NOTE — Telephone Encounter (Signed)
Pt called to see if Dr. Corliss Skains has received his request for pain medication?/gy

## 2012-09-23 ENCOUNTER — Telehealth (INDEPENDENT_AMBULATORY_CARE_PROVIDER_SITE_OTHER): Payer: Self-pay

## 2012-09-23 NOTE — Telephone Encounter (Signed)
Pt called stating he cannot get appt with pain clinic till end of January. Pt request refill of pain medication. Pt advised Dr Corliss Skains is not available and I will have to review request with out MD on call. Pt states something that can be called in will be fine since our office closes early today. Request and chart reviewed with Dr Maisie Fus. Narcotic request declined by Dr Maisie Fus. Per Pt request I left msg on [phone] # he gave that refill declined and he will need to call back later this week when Dr Corliss Skains is avail per Dr Maisie Fus request.

## 2012-09-25 ENCOUNTER — Telehealth (INDEPENDENT_AMBULATORY_CARE_PROVIDER_SITE_OTHER): Payer: Self-pay | Admitting: General Surgery

## 2012-09-25 NOTE — Telephone Encounter (Signed)
Pt is still waiting to hear from [place] at the pain mgmt clinic for appt in late January.  His last refill from Dr. Corliss Skains was 09/10/12.  He is calling for another refill of pain meds.  Please advise.

## 2012-09-25 NOTE — Telephone Encounter (Signed)
Called patient back to let him know that Dr Corliss Skains will write a Rx for him, for pain and he can come up to [facility] after 10 am on 09-26-2012 to pick up Rx

## 2012-09-25 NOTE — Telephone Encounter (Signed)
I will sign a prescription for him Friday morning.  He can come by to pick it up after 10 AM tomorrow.

## 2012-09-26 ENCOUNTER — Other Ambulatory Visit (INDEPENDENT_AMBULATORY_CARE_PROVIDER_SITE_OTHER): Payer: Self-pay | Admitting: Surgery

## 2012-09-26 ENCOUNTER — Telehealth (INDEPENDENT_AMBULATORY_CARE_PROVIDER_SITE_OTHER): Payer: Self-pay | Admitting: General Surgery

## 2012-09-26 MED ORDER — OXYCODONE-ACETAMINOPHEN 5-325 MG PO TABS: 1.0000 | ORAL_TABLET | ORAL | Status: DC | PRN

## 2012-09-26 NOTE — Telephone Encounter (Signed)
Written Rx for Percocet 5 /325 sig: 1 po q 4-6 prn for pain #50 with out refill the Rx is at the front desk at [facility]

## 2012-10-01 ENCOUNTER — Other Ambulatory Visit (HOSPITAL_COMMUNITY): Payer: Self-pay | Admitting: Physical Medicine and Rehabilitation

## 2012-10-01 ENCOUNTER — Ambulatory Visit (HOSPITAL_COMMUNITY)
Admission: RE | Admit: 2012-10-01 | Discharge: 2012-10-01 | Disposition: A | Discharge status: Home / Self Care | Payer: Managed Care, Other (non HMO) | Source: Ambulatory Visit | Attending: Physical Medicine and Rehabilitation | Admitting: Physical Medicine and Rehabilitation

## 2012-10-01 ENCOUNTER — Telehealth (INDEPENDENT_AMBULATORY_CARE_PROVIDER_SITE_OTHER): Payer: Self-pay | Admitting: General Surgery

## 2012-10-01 DIAGNOSIS — R109 Unspecified abdominal pain: Secondary | ICD-10-CM

## 2012-10-01 NOTE — Telephone Encounter (Signed)
Faxed back to [facility] that patient will not get a refill on Hydrocodone Per Dr Corliss Skains. Patient has been refer to the pain clinic. Last Rx was written on 09-26-2012 to get patient through until he is seen at the pain clinic

## 2016-05-22 ENCOUNTER — Emergency Department
Admission: EM | Admit: 2016-05-22 | Discharge: 2016-05-22 | Disposition: A | Discharge status: Home / Self Care | Payer: Managed Care, Other (non HMO) | Attending: Student | Admitting: Student

## 2016-05-22 ENCOUNTER — Encounter: Payer: Self-pay | Admitting: Emergency Medicine

## 2016-05-22 DIAGNOSIS — L03114 Cellulitis of left upper limb: Secondary | ICD-10-CM | POA: Insufficient documentation

## 2016-05-22 DIAGNOSIS — L03119 Cellulitis of unspecified part of limb: Secondary | ICD-10-CM

## 2016-05-22 DIAGNOSIS — F17200 Nicotine dependence, unspecified, uncomplicated: Secondary | ICD-10-CM | POA: Insufficient documentation

## 2016-05-22 MED ORDER — IBUPROFEN 800 MG PO TABS: 800.0000 mg | ORAL_TABLET | Freq: Three times a day (TID) | ORAL | 0 refills | Status: DC | PRN

## 2016-05-22 MED ORDER — HYDROCODONE-ACETAMINOPHEN 5-325 MG PO TABS: 1.0000 | ORAL_TABLET | Freq: Four times a day (QID) | ORAL | 0 refills | Status: DC | PRN

## 2016-05-22 MED ORDER — SULFAMETHOXAZOLE-TRIMETHOPRIM 800-160 MG PO TABS: 1.0000 | ORAL_TABLET | Freq: Two times a day (BID) | ORAL | 0 refills | Status: DC

## 2016-05-22 NOTE — ED Provider Notes (Signed)
[facility] Emergency Department Provider Note  ____________________________________________  Time seen: Approximately 11:58 AM  I have reviewed the triage vital signs and the nursing notes.   HISTORY  Chief Complaint Abscess    HPI [name] is a 45 y.o. male , NAD, presents emergency Department withover 1 week history of cellulitis to the left arm. Patient states he has a history of skin abscesses over the course of his lifetime. Has noted that he had a pustular skin sore about the left antecubital fossa appear approximately a week ago. Thought it was getting better with warm compresses but noted on Sunday it appeared to be coming to a head. Patient states he took a pin to attempt to drain the area last night. Has noted some clear oozing or weeping from the area. Woke this morning with more redness and swelling that he had had previously. Denies fever, body aches but has had chills at times. Has had no abdominal pain, nausea, vomiting, chest pain or shortness of breath. Denies any numbness, weakness, tingling. Has retained full range of motion of the left upper extremity without pain. Denies history of diabetes.    Past Medical History:  Diagnosis Date  . Arthritis    hands   . GERD (gastroesophageal reflux disease)    percocet causes reflux issues     Patient Active Problem List   Diagnosis Date Noted  . Low back pain 08/05/2012  . Right groin pain 03/14/2012  . Right inguinal hernia 06/05/2011    Past Surgical History:  Procedure Laterality Date  . HERNIA REPAIR    . INGUINAL HERNIA REPAIR  08/06/2011   Procedure: HERNIA REPAIR INGUINAL ADULT;  Surgeon: Wilmon ArmsMatthew K. Corliss Skainssuei, MD;  Location: [facility];  Service: General;  Laterality: Right;    Prior to Admission medications   Medication Sig Start Date End Date Taking? Authorizing Provider  gabapentin (NEURONTIN) 100 MG capsule Take 400 mg by mouth 4 (four) times daily.     Historical Provider,  MD  HYDROcodone-acetaminophen (NORCO) 5-325 MG tablet Take 1 tablet by mouth every 6 (six) hours as needed for severe pain. 05/22/16   Jami L Hagler, PA-C  ibuprofen (ADVIL,MOTRIN) 800 MG tablet Take 1 tablet (800 mg total) by mouth every 8 (eight) hours as needed (pain). 05/22/16   Jami L Hagler, PA-C  oxyCODONE-acetaminophen (PERCOCET/ROXICET) 5-325 MG per tablet Take 1 tablet by mouth every 4 (four) hours as needed for pain. 09/26/12   Manus RuddMatthew Tsuei, MD  sulfamethoxazole-trimethoprim (BACTRIM DS,SEPTRA DS) 800-160 MG tablet Take 1 tablet by mouth 2 (two) times daily. 05/22/16   Jami L Hagler, PA-C    Allergies Review of patient's allergies indicates no known allergies.  No family history on file.  Social History Social History  Substance Use Topics  . Smoking status: Current Every Day Smoker    Packs/day: 1.00    Years: 20.00  . Smokeless tobacco: Former NeurosurgeonUser  . Alcohol use No     Review of Systems  Constitutional: Positive chills but no fever, fatigue Cardiovascular: No chest pain. Respiratory: No shortness of breath.  Gastrointestinal: No abdominal pain.  No nausea, vomiting.   Musculoskeletal: Negative for  left upper extremity pain  Skin: positive oozing skin sore with surrounding redness and mild swelling about the left antecubital fossa. Negative for rash, bruising . Neurological: Negative for  numbness, weakness, tingling 10-point ROS otherwise negative.  ____________________________________________   PHYSICAL EXAM:  VITAL SIGNS: ED Triage Vitals  Enc Vitals Group  BP 05/22/16 1118 (!) 156/74     Pulse Rate 05/22/16 1118 77     Resp 05/22/16 1118 18     Temp 05/22/16 1118 98.6 F (37 C)     Temp Source 05/22/16 1118 Oral     SpO2 05/22/16 1118 100 %     Weight 05/22/16 1118 170 lb (77.1 kg)     Height 05/22/16 1118 5\' 9"  (1.753 m)     Head Circumference --      Peak Flow --      Pain Score 05/22/16 1119 5     Pain Loc --      Pain Edu? --      Excl. in  GC? --      Constitutional: Alert and oriented. Well appearing and in no acute distress. Eyes: Conjunctivae are normal.  Head: Atraumatic. Cardiovascular:  Good peripheral circulation with 2+ pulses noted in the left upper extremity . Respiratory: Normal respiratory effort without tachypnea or retractions.  Musculoskeletal:  Full range of motion of left upper extremity without pain or difficulty. Neurologic:  Normal speech and language. No gross focal neurologic deficits are appreciated.  Skin:  Skin about the left antecubital fossa with diffuse mild erythema and trace swelling. Central oozing skin sore is noted with trace yellow crusting. No induration or fluctuance noted below. Area is tender to palpation. Skin is warm, dry. No rash noted. Psychiatric: Mood and affect are normal. Speech and behavior are normal. Patient exhibits appropriate insight and judgement.   ____________________________________________   LABS  None ____________________________________________  EKG  None ____________________________________________  RADIOLOGY  None ____________________________________________    PROCEDURES  Procedure(s) performed: None   Procedures   Medications - No data to display   ____________________________________________   INITIAL IMPRESSION / ASSESSMENT AND PLAN / ED COURSE  Pertinent labs & imaging results that were available during my care of the patient were reviewed by me and considered in my medical decision making (see chart for details).  Clinical Course    Patient's diagnosis is consistent with Cellulitis of left elbow. Patient will be discharged home with prescriptions for Bactrim DS, ibuprofen and Norco to take as directed. Patient is to follow up with his primary care provider in 48 hours for wound recheck. Patient is given ED precautions to return to the ED for any worsening or new symptoms.    ____________________________________________  FINAL  CLINICAL IMPRESSION(S) / ED DIAGNOSES  Final diagnoses:  Cellulitis of elbow      NEW MEDICATIONS STARTED DURING THIS VISIT:  New Prescriptions   HYDROCODONE-ACETAMINOPHEN (NORCO) 5-325 MG TABLET    Take 1 tablet by mouth every 6 (six) hours as needed for severe pain.   IBUPROFEN (ADVIL,MOTRIN) 800 MG TABLET    Take 1 tablet (800 mg total) by mouth every 8 (eight) hours as needed (pain).   SULFAMETHOXAZOLE-TRIMETHOPRIM (BACTRIM DS,SEPTRA DS) 800-160 MG TABLET    Take 1 tablet by mouth 2 (two) times daily.         Hope Pigeon, PA-C 05/22/16 1217    Gayla Doss, MD 05/22/16 [phone]

## 2016-05-22 NOTE — Discharge Instructions (Signed)
Please see your PCP in 2 days for recheck.  If any worsening of pain, redness, swelling or onset of new symptoms such as fevers, abdominal pain, nausea, vomiting, please go tot he nearest emergency department.

## 2016-05-22 NOTE — ED Triage Notes (Signed)
Pt complains of abscess to left arm that started on Sunday. Pt states he tried to drain wound last night and this morning has redness around area and swelling noted around site.

## 2016-08-19 ENCOUNTER — Emergency Department: Payer: 59

## 2016-08-19 ENCOUNTER — Encounter: Payer: Self-pay | Admitting: Emergency Medicine

## 2016-08-19 ENCOUNTER — Emergency Department
Admission: EM | Admit: 2016-08-19 | Discharge: 2016-08-19 | Disposition: A | Discharge status: Home / Self Care | Payer: 59 | Attending: Emergency Medicine | Admitting: Emergency Medicine

## 2016-08-19 DIAGNOSIS — F17200 Nicotine dependence, unspecified, uncomplicated: Secondary | ICD-10-CM | POA: Insufficient documentation

## 2016-08-19 DIAGNOSIS — G8929 Other chronic pain: Secondary | ICD-10-CM | POA: Insufficient documentation

## 2016-08-19 DIAGNOSIS — R1012 Left upper quadrant pain: Secondary | ICD-10-CM | POA: Insufficient documentation

## 2016-08-19 DIAGNOSIS — Z7289 Other problems related to lifestyle: Secondary | ICD-10-CM | POA: Insufficient documentation

## 2016-08-19 DIAGNOSIS — Z79899 Other long term (current) drug therapy: Secondary | ICD-10-CM | POA: Insufficient documentation

## 2016-08-19 DIAGNOSIS — Z765 Malingerer [conscious simulation]: Secondary | ICD-10-CM

## 2016-08-19 DIAGNOSIS — R109 Unspecified abdominal pain: Secondary | ICD-10-CM

## 2016-08-19 LAB — COMPREHENSIVE METABOLIC PANEL
ALT: 24 U/L (ref 17–63)
ANION GAP: 9 (ref 5–15)
AST: 17 U/L (ref 15–41)
Albumin: 4.6 g/dL (ref 3.5–5.0)
Alkaline Phosphatase: 50 U/L (ref 38–126)
BUN: 13 mg/dL (ref 6–20)
CHLORIDE: 103 mmol/L (ref 101–111)
CO2: 27 mmol/L (ref 22–32)
Calcium: 9.9 mg/dL (ref 8.9–10.3)
Creatinine, Ser: 0.84 mg/dL (ref 0.61–1.24)
GFR calc non Af Amer: >60 mL/min (ref >60)
Glucose, Bld: 105 mg/dL — ABNORMAL HIGH (ref 65–99)
POTASSIUM: 4.9 mmol/L (ref 3.5–5.1)
SODIUM: 139 mmol/L (ref 135–145)
Total Bilirubin: 0.7 mg/dL (ref 0.3–1.2)
Total Protein: 8.1 g/dL (ref 6.5–8.1)

## 2016-08-19 LAB — CBC WITH DIFFERENTIAL/PLATELET
Basophils Absolute: 0 10*3/uL (ref 0–0.1)
Basophils Relative: 0 %
Eosinophils Absolute: 0.2 10*3/uL (ref 0–0.7)
Eosinophils Relative: 2 %
HEMATOCRIT: 44.5 % (ref 40.0–52.0)
HEMOGLOBIN: 15.2 g/dL (ref 13.0–18.0)
LYMPHS ABS: 1.8 10*3/uL (ref 1.0–3.6)
LYMPHS PCT: 18 %
MCH: 32.6 pg (ref 26.0–34.0)
MCHC: 34 g/dL (ref 32.0–36.0)
MCV: 95.8 fL (ref 80.0–100.0)
Monocytes Absolute: 0.7 10*3/uL (ref 0.2–1.0)
Monocytes Relative: 7 %
NEUTROS ABS: 7.3 10*3/uL — AB (ref 1.4–6.5)
NEUTROS PCT: 73 %
Platelets: 250 10*3/uL (ref 150–440)
RBC: 4.65 MIL/uL (ref 4.40–5.90)
RDW: 13.8 % (ref 11.5–14.5)
WBC: 10 10*3/uL (ref 3.8–10.6)

## 2016-08-19 LAB — URINALYSIS COMPLETE WITH MICROSCOPIC (ARMC ONLY)
BACTERIA UA: NONE SEEN
Bilirubin Urine: NEGATIVE
Glucose, UA: NEGATIVE mg/dL
HGB URINE DIPSTICK: NEGATIVE
Leukocytes, UA: NEGATIVE
Nitrite: NEGATIVE
PH: 7 (ref 5.0–8.0)
PROTEIN: NEGATIVE mg/dL
Specific Gravity, Urine: 1.013 (ref 1.005–1.030)

## 2016-08-19 LAB — URINE DRUG SCREEN, QUALITATIVE (ARMC ONLY)
AMPHETAMINES, UR SCREEN: NOT DETECTED
BARBITURATES, UR SCREEN: NOT DETECTED
BENZODIAZEPINE, UR SCRN: NOT DETECTED
Cannabinoid 50 Ng, Ur ~~LOC~~: POSITIVE — AB
Cocaine Metabolite,Ur ~~LOC~~: NOT DETECTED
MDMA (Ecstasy)Ur Screen: NOT DETECTED
METHADONE SCREEN, URINE: NOT DETECTED
Opiate, Ur Screen: NOT DETECTED
PHENCYCLIDINE (PCP) UR S: NOT DETECTED
Tricyclic, Ur Screen: NOT DETECTED

## 2016-08-19 MED ORDER — KETOROLAC TROMETHAMINE 30 MG/ML IJ SOLN
15.0000 mg | Freq: Once | INTRAMUSCULAR | Status: AC
  Administered 2016-08-19: 15 mg via INTRAVENOUS
  Filled 2016-08-19: qty 1

## 2016-08-19 MED ORDER — IOPAMIDOL (ISOVUE-300) INJECTION 61%
100.0000 mL | Freq: Once | INTRAVENOUS | Status: AC | PRN
  Administered 2016-08-19: 100 mL via INTRAVENOUS

## 2016-08-19 MED ORDER — ONDANSETRON HCL 4 MG/2ML IJ SOLN
4.0000 mg | Freq: Once | INTRAMUSCULAR | Status: AC
  Administered 2016-08-19: 4 mg via INTRAVENOUS

## 2016-08-19 MED ORDER — IOPAMIDOL (ISOVUE-300) INJECTION 61%
30.0000 mL | Freq: Once | INTRAVENOUS | Status: AC | PRN
  Administered 2016-08-19: 30 mL via ORAL

## 2016-08-19 MED ORDER — MORPHINE SULFATE (PF) 4 MG/ML IV SOLN
4.0000 mg | Freq: Once | INTRAVENOUS | Status: AC
  Administered 2016-08-19: 4 mg via INTRAVENOUS
  Filled 2016-08-19: qty 1

## 2016-08-19 MED ORDER — SODIUM CHLORIDE 0.9 % IV BOLUS (SEPSIS)
1000.0000 mL | Freq: Once | INTRAVENOUS | Status: AC
  Administered 2016-08-19: 1000 mL via INTRAVENOUS

## 2016-08-19 MED ORDER — ONDANSETRON HCL 4 MG/2ML IJ SOLN
INTRAMUSCULAR | Status: AC
  Administered 2016-08-19: 4 mg via INTRAVENOUS
  Filled 2016-08-19: qty 2

## 2016-08-19 NOTE — ED Provider Notes (Addendum)
[facility] Emergency Department Provider Note  ____________________________________________   I have reviewed the triage vital signs and the nursing notes.   HISTORY  Chief Complaint Abdominal Pain    HPI [name] is a 46 y.o. male Presents today complaining of chronic left upper abdominal pain. He states the pain has been there for actually for years. Every since he had a mesh repair in 2013. He states since that time, he has had recurrent abdominal pain. He had extensive workup at that time, back in 2014 he's had an endoscopy,he had an MRI and he had extensive surgery/GI/primary care workup in no acute tell him why he was having this he stopped seeking help because no one would be able to give him an answer.    The pain, however, never completely went away. Soaking in the tub makes it better. Food sometimes makes it worse. Patient states the pain has been now present for 4 years. He states sometimes he is afraid to eat. He has trouble sleeping because of the pain. He does have diarrhea "his whole life" with no melena or bright red blood. This is not changed. No urinary symptoms.     Past Medical History:  Diagnosis Date  . Arthritis    hands   . GERD (gastroesophageal reflux disease)    percocet causes reflux issues     Patient Active Problem List   Diagnosis Date Noted  . Low back pain 08/05/2012  . Right groin pain 03/14/2012  . Right inguinal hernia 06/05/2011    Past Surgical History:  Procedure Laterality Date  . HERNIA REPAIR    . INGUINAL HERNIA REPAIR  08/06/2011   Procedure: HERNIA REPAIR INGUINAL ADULT;  Surgeon: Wilmon Arms. Corliss Skains, MD;  Location: [facility];  Service: General;  Laterality: Right;    Prior to Admission medications   Medication Sig Start Date End Date Taking? Authorizing Provider  gabapentin (NEURONTIN) 100 MG capsule Take 400 mg by mouth 4 (four) times daily.     Historical Provider, MD   HYDROcodone-acetaminophen (NORCO) 5-325 MG tablet Take 1 tablet by mouth every 6 (six) hours as needed for severe pain. 05/22/16   Jami L Hagler, PA-C  ibuprofen (ADVIL,MOTRIN) 800 MG tablet Take 1 tablet (800 mg total) by mouth every 8 (eight) hours as needed (pain). 05/22/16   Jami L Hagler, PA-C  oxyCODONE-acetaminophen (PERCOCET/ROXICET) 5-325 MG per tablet Take 1 tablet by mouth every 4 (four) hours as needed for pain. 09/26/12   Manus Rudd, MD  sulfamethoxazole-trimethoprim (BACTRIM DS,SEPTRA DS) 800-160 MG tablet Take 1 tablet by mouth 2 (two) times daily. 05/22/16   Jami L Hagler, PA-C    Allergies Patient has no known allergies.  History reviewed. No pertinent family history.  Social History Social History  Substance Use Topics  . Smoking status: Current Every Day Smoker    Packs/day: 1.00    Years: 20.00  . Smokeless tobacco: Former Neurosurgeon  . Alcohol use No    Review of Systems Constitutional: No fever/chills Eyes: No visual changes. ENT: No sore throat. No stiff neck no neck pain Cardiovascular: Denies chest pain. Respiratory: Denies shortness of breath. Gastrointestinal:   no vomiting.  Positive diarrhea.  No constipation. Genitourinary: Negative for dysuria. Musculoskeletal: Negative lower extremity swelling Skin: Negative for rash. Neurological: Negative for severe headaches, focal weakness or numbness. 10-point ROS otherwise negative.  ____________________________________________   PHYSICAL EXAM:  VITAL SIGNS: ED Triage Vitals  Enc Vitals Group     BP  08/19/16 1142 131/85     Pulse Rate 08/19/16 1142 79     Resp 08/19/16 1142 18     Temp 08/19/16 1142 98.2 F (36.8 C)     Temp Source 08/19/16 1142 Oral     SpO2 08/19/16 1142 100 %     Weight 08/19/16 1143 155 lb (70.3 kg)     Height 08/19/16 1143 5\' 9"  (1.753 m)     Head Circumference --      Peak Flow --      Pain Score 08/19/16 1144 10     Pain Loc --      Pain Edu? --      Excl. in GC? --      Constitutional: Alert and oriented. Well appearing and in no acute distress. Eyes: Conjunctivae are normal. PERRL. EOMI. Head: Atraumatic. Nose: No congestion/rhinnorhea. Mouth/Throat: Mucous membranes are moist.  Oropharynx non-erythematous. Neck: No stridor.   Nontender with no meningismus Cardiovascular: Normal rate, regular rhythm. Grossly normal heart sounds.  Good peripheral circulation. Respiratory: Normal respiratory effort.  No retractions. Lungs CTAB. Abdominal: Soft and somewhat tractable tenderness to palpation of the left abdominal region upper and mid. No distention. No guarding no rebound GU: Normal testicular exam no pain no swelling or tenderness, no mass, normal penis note lesions or discharge. Back:  There is no focal tenderness or step off.  there is no midline tenderness there are no lesions noted. there is no CVA tenderness Musculoskeletal: No lower extremity tenderness, no upper extremity tenderness. No joint effusions, no DVT signs strong distal pulses no edema Neurologic:  Normal speech and language. No gross focal neurologic deficits are appreciated.  Skin:  Skin is warm, dry and intact. No rash noted. Psychiatric: Mood and affect are normal. Speech and behavior are normal.  ____________________________________________   LABS (all labs ordered are listed, but only abnormal results are displayed)  Labs Reviewed  C DIFFICILE QUICK SCREEN W PCR REFLEX  COMPREHENSIVE METABOLIC PANEL  CBC WITH DIFFERENTIAL/PLATELET  URINALYSIS COMPLETEWITH MICROSCOPIC ([facility] ONLY)  URINE DRUG SCREEN, QUALITATIVE ([facility] ONLY)   ____________________________________________  EKG  I personally interpreted any EKGs ordered by me or triage  ____________________________________________  RADIOLOGY  I reviewed any imaging ordered by me or triage that were performed during my shift and, if possible, patient and/or family made aware of any abnormal  findings. ____________________________________________   PROCEDURES  Procedure(s) performed: None  Procedures  Critical Care performed: None  ____________________________________________   INITIAL IMPRESSION / ASSESSMENT AND PLAN / ED COURSE  Pertinent labs & imaging results that were available during my care of the patient were reviewed by me and considered in my medical decision making (see chart for details).  Patient with chronic daily abdominal pain for 4 years. Has not sought help for this in the last 3. He has had extensive workup for this. His abdomen is nonsurgical. Patient really wants to "get to the bottom of this" today. He has an outpatient follow-up with urology and he is going to see his surgeon again. I have a splint him that she has is us determining the cause of his chronic abdominal pain is not very high in the emergency room. However, given that it is been 3 years since he had any imaging, and the risk he does exist of some occult oncologic process, we will obtain CT scan as a precaution as he states he is having trouble maintaining his weight because of the pain. Very low suspicion for ischemia or low  flow state. Very low suspicion for diverticulitis, abscess, appendicitis, volvulus, etc. Exam and vital signs are reassuring  ----------------------------------------- 2:18 PM on 08/19/2016 -----------------------------------------  Patient is becoming ever more strident in his demands for narcotic pain medication. He states morphine does not "touch" his abdominal pain. [facility] suggests the patient had multiple different narcotic prescriptions over the last few months. He had 240 tramadol is given October 7, 180 oxycodone given October 26, 240 tramadol and November 6, and 12 oxycodone given on November 22. Patient is now demanding Dilaudid pain medication. I've explained him that I'm not going to give him any more narcotic pain medication for his  abdominal pain that they have a chronic pain policy here and I have already given him the benefit of the doubt by giving him any pain medication. He is angry about this. I will try Toradol to see if that helps his chronic pain. If it does not, he is threatening to leave. I explained to him that anytime he wishes to depart the department he certainly can't we will not be giving him any further narcotic pain medication.   ----------------------------------------- 2:38 PM on 08/19/2016 -----------------------------------------  I have informed the patient is refusing a CT scan. We'll discharge him.  Clinical Course    ____________________________________________   FINAL CLINICAL IMPRESSION(S) / ED DIAGNOSES  Final diagnoses:  None      This chart was dictated using voice recognition software.  Despite best efforts to proofread,  errors can occur which can change meaning.      Jeanmarie PlantJames A McShane, MD 08/19/16 1253    Jeanmarie PlantJames A McShane, MD 08/19/16 1420    Jeanmarie PlantJames A McShane, MD 08/19/16 [phone]

## 2016-08-19 NOTE — ED Triage Notes (Signed)
abd pain x 2 weeks  - has appt with urologist. "something clogged up in my testicles".

## 2016-08-19 NOTE — ED Notes (Signed)
Patient states his pain improves when he is in water. States he smokes "dope" every now and then. Last time was on thanksgiving.

## 2016-08-19 NOTE — ED Notes (Signed)
Patient refused CT scan. MD aware.

## 2018-05-12 ENCOUNTER — Ambulatory Visit
Admission: RE | Admit: 2018-05-12 | Discharge: 2018-05-12 | Disposition: A | Discharge status: Home / Self Care | Payer: Disability Insurance | Source: Ambulatory Visit | Attending: Family Medicine | Admitting: Family Medicine

## 2018-05-12 ENCOUNTER — Other Ambulatory Visit: Payer: Self-pay | Admitting: Family Medicine

## 2018-05-12 DIAGNOSIS — M25511 Pain in right shoulder: Secondary | ICD-10-CM | POA: Diagnosis not present

## 2018-05-12 DIAGNOSIS — M25551 Pain in right hip: Secondary | ICD-10-CM | POA: Diagnosis present

## 2018-05-12 DIAGNOSIS — M1990 Unspecified osteoarthritis, unspecified site: Secondary | ICD-10-CM

## 2020-01-20 ENCOUNTER — Other Ambulatory Visit: Payer: Self-pay

## 2020-01-20 ENCOUNTER — Emergency Department
Admission: EM | Admit: 2020-01-20 | Discharge: 2020-01-20 | Disposition: A | Discharge status: Home / Self Care | Payer: BC Managed Care – PPO | Attending: Student | Admitting: Student

## 2020-01-20 ENCOUNTER — Encounter: Payer: Self-pay | Admitting: Emergency Medicine

## 2020-01-20 ENCOUNTER — Emergency Department: Payer: BC Managed Care – PPO

## 2020-01-20 DIAGNOSIS — R109 Unspecified abdominal pain: Secondary | ICD-10-CM | POA: Diagnosis present

## 2020-01-20 DIAGNOSIS — K859 Acute pancreatitis without necrosis or infection, unspecified: Secondary | ICD-10-CM | POA: Diagnosis not present

## 2020-01-20 DIAGNOSIS — N029 Recurrent and persistent hematuria with unspecified morphologic changes: Secondary | ICD-10-CM | POA: Diagnosis not present

## 2020-01-20 DIAGNOSIS — F17210 Nicotine dependence, cigarettes, uncomplicated: Secondary | ICD-10-CM | POA: Insufficient documentation

## 2020-01-20 LAB — COMPREHENSIVE METABOLIC PANEL
ALT: 19 U/L (ref 0–44)
AST: 18 U/L (ref 15–41)
Albumin: 4.3 g/dL (ref 3.5–5.0)
Alkaline Phosphatase: 65 U/L (ref 38–126)
Anion gap: 6 (ref 5–15)
BUN: 13 mg/dL (ref 6–20)
CO2: 26 mmol/L (ref 22–32)
Calcium: 9.1 mg/dL (ref 8.9–10.3)
Chloride: 107 mmol/L (ref 98–111)
Creatinine, Ser: 0.67 mg/dL (ref 0.61–1.24)
GFR calc Af Amer: >60 mL/min (ref >60)
GFR calc non Af Amer: >60 mL/min (ref >60)
Glucose, Bld: 101 mg/dL — ABNORMAL HIGH (ref 70–99)
Potassium: 4.1 mmol/L (ref 3.5–5.1)
Sodium: 139 mmol/L (ref 135–145)
Total Bilirubin: 0.6 mg/dL (ref 0.3–1.2)
Total Protein: 7.6 g/dL (ref 6.5–8.1)

## 2020-01-20 LAB — URINALYSIS, COMPLETE (UACMP) WITH MICROSCOPIC
Bacteria, UA: NONE SEEN
Bilirubin Urine: NEGATIVE
Glucose, UA: NEGATIVE mg/dL
Ketones, ur: NEGATIVE mg/dL
Leukocytes,Ua: NEGATIVE
Nitrite: NEGATIVE
Protein, ur: 100 mg/dL — AB
RBC / HPF: >50 RBC/hpf — ABNORMAL HIGH (ref 0–5)
Specific Gravity, Urine: 1.019 (ref 1.005–1.030)
Squamous Epithelial / LPF: NONE SEEN (ref 0–5)
pH: 7 (ref 5.0–8.0)

## 2020-01-20 LAB — CBC
HCT: 41.7 % (ref 39.0–52.0)
Hemoglobin: 13.9 g/dL (ref 13.0–17.0)
MCH: 31.5 pg (ref 26.0–34.0)
MCHC: 33.3 g/dL (ref 30.0–36.0)
MCV: 94.6 fL (ref 80.0–100.0)
Platelets: 315 10*3/uL (ref 150–400)
RBC: 4.41 MIL/uL (ref 4.22–5.81)
RDW: 12.9 % (ref 11.5–15.5)
WBC: 8.5 10*3/uL (ref 4.0–10.5)
nRBC: 0 % (ref 0.0–0.2)

## 2020-01-20 LAB — CK: Total CK: 78 U/L (ref 49–397)

## 2020-01-20 LAB — LIPASE, BLOOD: Lipase: 105 U/L — ABNORMAL HIGH (ref 11–51)

## 2020-01-20 MED ORDER — HYDROCODONE-ACETAMINOPHEN 5-325 MG PO TABS: 1.0000 | ORAL_TABLET | Freq: Four times a day (QID) | ORAL | 0 refills | Status: DC | PRN

## 2020-01-20 MED ORDER — SODIUM CHLORIDE 0.9% FLUSH: 3.0000 mL | Freq: Once | INTRAVENOUS | Status: DC

## 2020-01-20 MED ORDER — KETOROLAC TROMETHAMINE 60 MG/2ML IM SOLN
15.0000 mg | Freq: Once | INTRAMUSCULAR | Status: AC
  Administered 2020-01-20: 15 mg via INTRAMUSCULAR
  Filled 2020-01-20: qty 2

## 2020-01-20 MED ORDER — SODIUM CHLORIDE 0.9 % IV BOLUS
1000.0000 mL | Freq: Once | INTRAVENOUS | Status: AC
  Administered 2020-01-20: 1000 mL via INTRAVENOUS

## 2020-01-20 MED ORDER — IOHEXOL 300 MG/ML  SOLN
100.0000 mL | Freq: Once | INTRAMUSCULAR | Status: AC | PRN
  Administered 2020-01-20: 100 mL via INTRAVENOUS
  Filled 2020-01-20: qty 100
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
## 2020-01-20 NOTE — Discharge Instructions (Signed)
Please call and schedule an appointment with the urologist.  See primary care if your abdominal pain does not improve over the next couple of days.  Return to the ER for symptoms that change or worsen if unable to schedule an appointment.

## 2020-01-20 NOTE — ED Notes (Signed)
Full rainbow of labs sent.
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
## 2020-01-20 NOTE — ED Notes (Signed)
Pt states having very dark urine and cramping in his lower abdomen. Pt states getting up hurts him more in his lower abdomen but when he's up it doesn't hurt as bad. Denies fevers and blood in urine.
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
## 2020-01-22 ENCOUNTER — Encounter: Payer: Self-pay | Admitting: Urology

## 2020-01-22 ENCOUNTER — Other Ambulatory Visit: Payer: Self-pay

## 2020-01-22 ENCOUNTER — Ambulatory Visit (INDEPENDENT_AMBULATORY_CARE_PROVIDER_SITE_OTHER): Payer: BC Managed Care – PPO | Admitting: Urology

## 2020-01-22 VITALS — BP 127/72 | HR 85 | Ht 69.0 in | Wt 153.0 lb

## 2020-01-22 DIAGNOSIS — R310 Gross hematuria: Secondary | ICD-10-CM | POA: Diagnosis not present
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
## 2020-02-02 ENCOUNTER — Ambulatory Visit (INDEPENDENT_AMBULATORY_CARE_PROVIDER_SITE_OTHER): Payer: BC Managed Care – PPO | Admitting: Urology

## 2020-02-02 ENCOUNTER — Other Ambulatory Visit: Payer: Self-pay

## 2020-02-02 VITALS — BP 132/82 | HR 73 | Ht 65.0 in | Wt 155.0 lb

## 2020-02-02 DIAGNOSIS — R310 Gross hematuria: Secondary | ICD-10-CM | POA: Diagnosis not present

## 2020-02-02 MED ORDER — DIAZEPAM 10 MG PO TABS: 10.0000 mg | ORAL_TABLET | Freq: Once | ORAL | 0 refills | Status: AC

## 2020-02-03 LAB — MICROSCOPIC EXAMINATION: Bacteria, UA: NONE SEEN

## 2020-02-03 LAB — URINALYSIS, COMPLETE
Bilirubin, UA: NEGATIVE
Glucose, UA: NEGATIVE
Ketones, UA: NEGATIVE
Leukocytes,UA: NEGATIVE
Nitrite, UA: NEGATIVE
Protein,UA: NEGATIVE
Specific Gravity, UA: 1.02 (ref 1.005–1.030)
Urobilinogen, Ur: 0.2 mg/dL (ref 0.2–1.0)
pH, UA: 6 (ref 5.0–7.5)
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
## 2020-02-23 ENCOUNTER — Ambulatory Visit (INDEPENDENT_AMBULATORY_CARE_PROVIDER_SITE_OTHER): Payer: BC Managed Care – PPO | Admitting: Urology

## 2020-02-23 ENCOUNTER — Other Ambulatory Visit: Payer: Self-pay

## 2020-02-23 ENCOUNTER — Other Ambulatory Visit: Payer: Self-pay | Admitting: Urology

## 2020-02-23 ENCOUNTER — Encounter: Payer: Self-pay | Admitting: Urology

## 2020-02-23 VITALS — BP 121/65 | HR 98

## 2020-02-23 DIAGNOSIS — R310 Gross hematuria: Secondary | ICD-10-CM

## 2020-02-23 MED ORDER — IMIQUIMOD 5 % EX CREA: TOPICAL_CREAM | CUTANEOUS | 1 refills | Status: AC

## 2020-02-23 MED ORDER — DIAZEPAM 10 MG PO TABS: 10.0000 mg | ORAL_TABLET | Freq: Once | ORAL | 0 refills | Status: AC

## 2020-02-24 LAB — URINALYSIS, COMPLETE
Bilirubin, UA: NEGATIVE
Glucose, UA: NEGATIVE
Ketones, UA: NEGATIVE
Leukocytes,UA: NEGATIVE
Nitrite, UA: NEGATIVE
Protein,UA: NEGATIVE
RBC, UA: NEGATIVE
Specific Gravity, UA: 1.02 (ref 1.005–1.030)
Urobilinogen, Ur: 0.2 mg/dL (ref 0.2–1.0)
pH, UA: 7 (ref 5.0–7.5)

## 2020-02-24 LAB — MICROSCOPIC EXAMINATION
Bacteria, UA: NONE SEEN
Epithelial Cells (non renal): NONE SEEN /hpf (ref 0–10)

## 2020-03-18 ENCOUNTER — Ambulatory Visit
Admission: EM | Admit: 2020-03-18 | Discharge: 2020-03-18 | Disposition: A | Discharge status: Home / Self Care | Payer: BC Managed Care – PPO | Attending: Physician Assistant | Admitting: Physician Assistant

## 2020-03-18 DIAGNOSIS — R059 Cough, unspecified: Secondary | ICD-10-CM

## 2020-03-18 DIAGNOSIS — R05 Cough: Secondary | ICD-10-CM

## 2020-03-18 DIAGNOSIS — R0981 Nasal congestion: Secondary | ICD-10-CM

## 2020-03-18 DIAGNOSIS — H9201 Otalgia, right ear: Secondary | ICD-10-CM

## 2020-03-18 MED ORDER — PREDNISONE 50 MG PO TABS
50.0000 mg | ORAL_TABLET | Freq: Every day | ORAL | 0 refills | Status: DC
Start: 2020-03-18 — End: 2022-02-09

## 2020-03-18 MED ORDER — ALBUTEROL SULFATE HFA 108 (90 BASE) MCG/ACT IN AERS: 1.0000 | INHALATION_SPRAY | Freq: Four times a day (QID) | RESPIRATORY_TRACT | 0 refills | Status: DC | PRN

## 2020-03-18 MED ORDER — AZITHROMYCIN 250 MG PO TABS
250.0000 mg | ORAL_TABLET | Freq: Every day | ORAL | 0 refills | Status: DC
Start: 2020-03-18 — End: 2022-02-09

## 2020-03-18 NOTE — ED Provider Notes (Signed)
[facility]    CSN: [id number] Arrival date & time: 03/18/20  1121      History   Chief Complaint Chief Complaint  Patient presents with   Shortness of Breath    HPI [name] is a 49 y.o. male.   49 year old malecomes in for 7 day of URI symptoms. Had productive cough, congestion that improved 3 days ago, now worsening. Now with chest tightness, shortness of breath, can still tolerate normal activity. tmax 102, no antipyretics. Denies abdominal pain, nausea, vomiting, diarrhea. Denies loss of taste/smell. Current every day smoker.      Past Medical History:  Diagnosis Date   Arthritis    hands    GERD (gastroesophageal reflux disease)    percocet causes reflux issues     Patient Active Problem List   Diagnosis Date Noted   Low back pain 08/05/2012   Right groin pain 03/14/2012   Right inguinal hernia 06/05/2011    Past Surgical History:  Procedure Laterality Date   HERNIA REPAIR     INGUINAL HERNIA REPAIR  08/06/2011   Procedure: HERNIA REPAIR INGUINAL ADULT;  Surgeon: Wilmon Arms. Corliss Skains, MD;  Location: [facility];  Service: General;  Laterality: Right;       Home Medications    Prior to Admission medications   Medication Sig Start Date End Date Taking? Authorizing Provider  albuterol (VENTOLIN HFA) 108 (90 Base) MCG/ACT inhaler Inhale 1-2 puffs into the lungs every 6 (six) hours as needed for wheezing or shortness of breath. 03/18/20   Cathie Hoops, Boleslaus Holloway V, PA-C  amphetamine-dextroamphetamine (ADDERALL) 20 MG tablet Take 20 mg by mouth daily as needed. 01/05/20   [provider]  azithromycin (ZITHROMAX) 250 MG tablet Take 1 tablet (250 mg total) by mouth daily. Take first 2 tablets together, then 1 every day until finished. 03/18/20   Cathie Hoops, Gianna Calef V, PA-C  gabapentin (NEURONTIN) 100 MG capsule Take 400 mg by mouth 4 (four) times daily.     [provider]  gabapentin (NEURONTIN) 800 MG tablet Take 800 mg by mouth every 8 (eight)  hours. 02/12/20   [provider]  imiquimod Mathis Dad) 5 % cream Apply to affected area three times weekly 02/23/20 02/22/21  Vanna Scotland, MD  ketorolac (TORADOL) 10 MG tablet Take 10 mg by mouth every 4 (four) hours as needed. 12/22/19   [provider]  Oxycodone HCl 10 MG TABS Take 10 mg by mouth every 4 (four) hours as needed. 12/26/19   [provider]  predniSONE (DELTASONE) 50 MG tablet Take 1 tablet (50 mg total) by mouth daily with breakfast. 03/18/20   Belinda Fisher, PA-C    Family History Family History  Problem Relation Age of Onset   Cancer Paternal Aunt    Prostate cancer Neg Hx    Bladder Cancer Neg Hx    Kidney cancer Neg Hx     Social History Social History   Tobacco Use   Smoking status: Current Every Day Smoker    Packs/day: 1.00    Years: 20.00    Pack years: 20.00   Smokeless tobacco: Former Neurosurgeon  Substance Use Topics   Alcohol use: No   Drug use: No     Allergies   Patient has no known allergies.   Review of Systems Review of Systems  Reason unable to perform ROS: See HPI as above.     Physical Exam Triage Vital Signs ED Triage Vitals [03/18/20 1153]  Enc Vitals Group  BP (!) 156/99     Pulse Rate 97     Resp 18     Temp 98.2 F (36.8 C)     Temp Source Oral     SpO2 97 %     Weight      Height      Head Circumference      Peak Flow      Pain Score 3     Pain Loc      Pain Edu?      Excl. in [place]?    No data found.  Updated Vital Signs BP (!) 156/99 (BP Location: Left Arm)    Pulse 97    Temp 98.2 F (36.8 C) (Oral)    Resp 18    SpO2 97%   Physical Exam Constitutional:      General: He is not in acute distress.    Appearance: Normal appearance. He is not ill-appearing, toxic-appearing or diaphoretic.  HENT:     Head: Normocephalic and atraumatic.     Right Ear: Tympanic membrane, ear canal and external ear normal. Tympanic membrane is not erythematous or bulging.     Left Ear: Tympanic membrane,  ear canal and external ear normal. Tympanic membrane is not erythematous or bulging.     Nose:     Right Sinus: No maxillary sinus tenderness or frontal sinus tenderness.     Left Sinus: No maxillary sinus tenderness or frontal sinus tenderness.     Mouth/Throat:     Mouth: Mucous membranes are moist.     Pharynx: Oropharynx is clear. Uvula midline.  Cardiovascular:     Rate and Rhythm: Normal rate and regular rhythm.     Heart sounds: Normal heart sounds. No murmur heard.  No friction rub. No gallop.   Pulmonary:     Effort: Pulmonary effort is normal. No accessory muscle usage, prolonged expiration, respiratory distress or retractions.     Comments: Lungs clear to auscultation without adventitious lung sounds. Musculoskeletal:     Cervical back: Normal range of motion and neck supple.  Neurological:     General: No focal deficit present.     Mental Status: He is alert and oriented to person, place, and time.      UC Treatments / Results  Labs (all labs ordered are listed, but only abnormal results are displayed) Labs Reviewed - No data to display  EKG   Radiology No results found.  Procedures Procedures (including critical care time)  Medications Ordered in UC Medications - No data to display  Initial Impression / Assessment and Plan / UC Course  I have reviewed the triage vital signs and the nursing notes.  Pertinent labs & imaging results that were available during my care of the patient were reviewed by me and considered in my medical decision making (see chart for details).    Patient declined COVID testing. Afebrile, nontoxic in appearance. LCTAB. Will cover for bronchitis given extensive smoking history. Prednisone, azithromycin as directed. Albuterol as needed. Return precautions given.  Final Clinical Impressions(s) / UC Diagnoses   Final diagnoses:  Cough  Nasal congestion  Right ear pain   ED Prescriptions    Medication Sig Dispense Auth. Provider    predniSONE (DELTASONE) 50 MG tablet Take 1 tablet (50 mg total) by mouth daily with breakfast. 5 tablet Harper Vandervoort V, PA-C   azithromycin (ZITHROMAX) 250 MG tablet Take 1 tablet (250 mg total) by mouth daily. Take first 2 tablets together, then 1 every  day until finished. 6 tablet Jaira Canady V, PA-C   albuterol (VENTOLIN HFA) 108 (90 Base) MCG/ACT inhaler Inhale 1-2 puffs into the lungs every 6 (six) hours as needed for wheezing or shortness of breath. 8 g Belinda Fisher, PA-C     PDMP not reviewed this encounter.   Belinda Fisher, PA-C 03/18/20 1231

## 2020-03-18 NOTE — Discharge Instructions (Signed)
Start prednisone and azithromycin for bronchitis. Albuterol as needed. Keep hydrated, your urine should be clear to pale yellow in color. Tylenol/motrin for fever and pain. Monitor for any worsening of symptoms, chest pain, shortness of breath, wheezing, swelling of the throat, go to the emergency department for further evaluation needed.

## 2020-03-18 NOTE — ED Triage Notes (Signed)
Pt states had cough and congestion since last weekend. States having SOB with pain on inspiration to both sides.

## 2020-05-25 ENCOUNTER — Encounter: Payer: Self-pay | Admitting: Urology

## 2020-05-25 ENCOUNTER — Ambulatory Visit: Payer: Self-pay | Admitting: Urology

## 2022-02-09 ENCOUNTER — Emergency Department (HOSPITAL_COMMUNITY): Payer: No Typology Code available for payment source | Admitting: Certified Registered Nurse Anesthetist

## 2022-02-09 ENCOUNTER — Encounter (HOSPITAL_COMMUNITY): Payer: Self-pay | Admitting: *Deleted

## 2022-02-09 ENCOUNTER — Encounter (HOSPITAL_COMMUNITY)
Admission: EM | Disposition: A | Discharge status: Home / Self Care | Payer: Self-pay | Source: Home / Self Care | Attending: Emergency Medicine

## 2022-02-09 ENCOUNTER — Emergency Department (HOSPITAL_COMMUNITY): Payer: No Typology Code available for payment source

## 2022-02-09 ENCOUNTER — Ambulatory Visit (HOSPITAL_COMMUNITY)
Admission: EM | Admit: 2022-02-09 | Discharge: 2022-02-09 | Disposition: A | Discharge status: Home / Self Care | Payer: No Typology Code available for payment source | Attending: Student | Admitting: Student

## 2022-02-09 ENCOUNTER — Other Ambulatory Visit: Payer: Self-pay

## 2022-02-09 ENCOUNTER — Emergency Department (EMERGENCY_DEPARTMENT_HOSPITAL): Payer: No Typology Code available for payment source | Admitting: Certified Registered Nurse Anesthetist

## 2022-02-09 DIAGNOSIS — Z87891 Personal history of nicotine dependence: Secondary | ICD-10-CM | POA: Diagnosis not present

## 2022-02-09 DIAGNOSIS — M71162 Other infective bursitis, left knee: Secondary | ICD-10-CM

## 2022-02-09 HISTORY — PX: IRRIGATION AND DEBRIDEMENT KNEE: SHX5185

## 2022-02-09 LAB — SYNOVIAL CELL COUNT + DIFF, W/ CRYSTALS
Crystals, Fluid: NONE SEEN
Eosinophils-Synovial: 0 % (ref 0–1)
Lymphocytes-Synovial Fld: 2 % (ref 0–20)
Monocyte-Macrophage-Synovial Fluid: 2 % — ABNORMAL LOW (ref 50–90)
Neutrophil, Synovial: 96 % — ABNORMAL HIGH (ref 0–25)
WBC, Synovial: 219090 /mm3 — ABNORMAL HIGH (ref 0–200)

## 2022-02-09 LAB — BASIC METABOLIC PANEL
Anion gap: 7 (ref 5–15)
BUN: 13 mg/dL (ref 6–20)
CO2: 28 mmol/L (ref 22–32)
Calcium: 9.1 mg/dL (ref 8.9–10.3)
Chloride: 102 mmol/L (ref 98–111)
Creatinine, Ser: 0.68 mg/dL (ref 0.61–1.24)
GFR, Estimated: >60 mL/min (ref >60)
Glucose, Bld: 105 mg/dL — ABNORMAL HIGH (ref 70–99)
Potassium: 3.5 mmol/L (ref 3.5–5.1)
Sodium: 137 mmol/L (ref 135–145)

## 2022-02-09 LAB — CBC
HCT: 42.3 % (ref 39.0–52.0)
Hemoglobin: 14 g/dL (ref 13.0–17.0)
MCH: 31.1 pg (ref 26.0–34.0)
MCHC: 33.1 g/dL (ref 30.0–36.0)
MCV: 94 fL (ref 80.0–100.0)
Platelets: 275 10*3/uL (ref 150–400)
RBC: 4.5 MIL/uL (ref 4.22–5.81)
RDW: 13 % (ref 11.5–15.5)
WBC: 11 10*3/uL — ABNORMAL HIGH (ref 4.0–10.5)
nRBC: 0 % (ref 0.0–0.2)

## 2022-02-09 SURGERY — IRRIGATION AND DEBRIDEMENT KNEE
Anesthesia: General | Site: Knee | Laterality: Left

## 2022-02-09 MED ORDER — DOXYCYCLINE HYCLATE 50 MG PO CAPS: 100.0000 mg | ORAL_CAPSULE | Freq: Two times a day (BID) | ORAL | 0 refills | Status: DC

## 2022-02-09 MED ORDER — AMISULPRIDE (ANTIEMETIC) 5 MG/2ML IV SOLN: 10.0000 mg | Freq: Once | INTRAVENOUS | Status: DC | PRN

## 2022-02-09 MED ORDER — LIDOCAINE 2% (20 MG/ML) 5 ML SYRINGE
INTRAMUSCULAR | Status: DC | PRN
  Administered 2022-02-09: 40 mg via INTRAVENOUS

## 2022-02-09 MED ORDER — EPHEDRINE SULFATE-NACL 50-0.9 MG/10ML-% IV SOSY
PREFILLED_SYRINGE | INTRAVENOUS | Status: DC | PRN
  Administered 2022-02-09: 10 mg via INTRAVENOUS

## 2022-02-09 MED ORDER — ONDANSETRON HCL 4 MG/2ML IJ SOLN
INTRAMUSCULAR | Status: DC | PRN
  Administered 2022-02-09: 4 mg via INTRAVENOUS

## 2022-02-09 MED ORDER — LACTATED RINGERS IV SOLN: INTRAVENOUS | Status: DC

## 2022-02-09 MED ORDER — CEPHALEXIN 500 MG PO CAPS: 500.0000 mg | ORAL_CAPSULE | Freq: Four times a day (QID) | ORAL | 0 refills | Status: DC

## 2022-02-09 MED ORDER — SODIUM CHLORIDE 0.9 % IR SOLN
Status: DC | PRN
  Administered 2022-02-09 (×3): 3000 mL

## 2022-02-09 MED ORDER — MIDAZOLAM HCL 2 MG/2ML IJ SOLN
INTRAMUSCULAR | Status: DC | PRN
  Administered 2022-02-09 (×2): 1 mg via INTRAVENOUS

## 2022-02-09 MED ORDER — OXYCODONE-ACETAMINOPHEN 5-325 MG PO TABS
2.0000 | ORAL_TABLET | Freq: Once | ORAL | Status: AC
  Administered 2022-02-09: 2 via ORAL
  Filled 2022-02-09: qty 2

## 2022-02-09 MED ORDER — HYDROCODONE-ACETAMINOPHEN 10-325 MG PO TABS: 1.0000 | ORAL_TABLET | Freq: Four times a day (QID) | ORAL | 0 refills | Status: AC | PRN

## 2022-02-09 MED ORDER — CHLORHEXIDINE GLUCONATE 0.12 % MT SOLN
15.0000 mL | Freq: Once | OROMUCOSAL | Status: AC
  Administered 2022-02-09: 15 mL via OROMUCOSAL

## 2022-02-09 MED ORDER — HYDROMORPHONE HCL 1 MG/ML IJ SOLN
0.2500 mg | INTRAMUSCULAR | Status: DC | PRN
  Administered 2022-02-09: 0.5 mg via INTRAVENOUS

## 2022-02-09 MED ORDER — ORAL CARE MOUTH RINSE: 15.0000 mL | Freq: Once | OROMUCOSAL | Status: AC

## 2022-02-09 MED ORDER — HYDROMORPHONE HCL 1 MG/ML IJ SOLN
INTRAMUSCULAR | Status: AC
  Administered 2022-02-09: 0.5 mg via INTRAVENOUS
  Filled 2022-02-09: qty 1

## 2022-02-09 MED ORDER — SODIUM CHLORIDE 0.9 % IR SOLN
Status: DC | PRN
  Administered 2022-02-09: 1000 mL

## 2022-02-09 MED ORDER — MEPERIDINE HCL 50 MG/ML IJ SOLN: 6.2500 mg | INTRAMUSCULAR | Status: DC | PRN

## 2022-02-09 MED ORDER — LIDOCAINE-EPINEPHRINE 2 %-1:100000 IJ SOLN
20.0000 mL | Freq: Once | INTRAMUSCULAR | Status: DC
  Filled 2022-02-09: qty 1

## 2022-02-09 MED ORDER — LIDOCAINE HCL (PF) 1 % IJ SOLN
INTRAMUSCULAR | Status: AC
  Administered 2022-02-09: 30 mL
  Filled 2022-02-09: qty 30

## 2022-02-09 MED ORDER — PROPOFOL 10 MG/ML IV BOLUS
INTRAVENOUS | Status: DC | PRN
  Administered 2022-02-09: 200 mg via INTRAVENOUS

## 2022-02-09 MED ORDER — MIDAZOLAM HCL 2 MG/2ML IJ SOLN
INTRAMUSCULAR | Status: AC
  Filled 2022-02-09: qty 2

## 2022-02-09 MED ORDER — CEFAZOLIN SODIUM-DEXTROSE 2-4 GM/100ML-% IV SOLN
2.0000 g | Freq: Once | INTRAVENOUS | Status: AC
Start: 2022-02-09 — End: 2022-02-09
  Administered 2022-02-09: 2 g via INTRAVENOUS

## 2022-02-09 MED ORDER — OXYCODONE HCL 5 MG/5ML PO SOLN: 5.0000 mg | Freq: Once | ORAL | Status: DC | PRN

## 2022-02-09 MED ORDER — PROMETHAZINE HCL 25 MG/ML IJ SOLN: 6.2500 mg | INTRAMUSCULAR | Status: DC | PRN

## 2022-02-09 MED ORDER — PROPOFOL 10 MG/ML IV BOLUS
INTRAVENOUS | Status: AC
Start: 2022-02-09 — End: ?
  Filled 2022-02-09: qty 20

## 2022-02-09 MED ORDER — FENTANYL CITRATE (PF) 250 MCG/5ML IJ SOLN
INTRAMUSCULAR | Status: DC | PRN
  Administered 2022-02-09 (×3): 50 ug via INTRAVENOUS

## 2022-02-09 MED ORDER — OXYCODONE HCL 5 MG PO TABS: 5.0000 mg | ORAL_TABLET | Freq: Once | ORAL | Status: DC | PRN

## 2022-02-09 MED ORDER — FENTANYL CITRATE (PF) 250 MCG/5ML IJ SOLN
INTRAMUSCULAR | Status: AC
  Filled 2022-02-09: qty 5

## 2022-02-09 MED ORDER — CEFAZOLIN SODIUM-DEXTROSE 2-4 GM/100ML-% IV SOLN
INTRAVENOUS | Status: AC
  Filled 2022-02-09: qty 100

## 2022-02-09 MED ORDER — EPHEDRINE 5 MG/ML INJ
INTRAVENOUS | Status: AC
  Filled 2022-02-09: qty 5

## 2022-02-09 SURGICAL SUPPLY — 41 items
APL PRP STRL LF DISP 70% ISPRP (MISCELLANEOUS) ×1
BAG COUNTER SPONGE SURGICOUNT (BAG) IMPLANT
BAG SPEC THK2 15X12 ZIP CLS (MISCELLANEOUS) ×1
BAG SPNG CNTER NS LX DISP (BAG)
BAG ZIPLOCK 12X15 (MISCELLANEOUS) ×2 IMPLANT
BANDAGE ESMARK 6X9 LF (GAUZE/BANDAGES/DRESSINGS) ×1 IMPLANT
BNDG CMPR 9X6 STRL LF SNTH (GAUZE/BANDAGES/DRESSINGS)
BNDG ESMARK 6X9 LF (GAUZE/BANDAGES/DRESSINGS)
BNDG GAUZE ELAST 4 BULKY (GAUZE/BANDAGES/DRESSINGS) ×2 IMPLANT
CANISTER PREVENA PLUS 150 (CANNISTER) ×1 IMPLANT
CHLORAPREP W/TINT 26 (MISCELLANEOUS) ×2 IMPLANT
COVER SURGICAL LIGHT HANDLE (MISCELLANEOUS) ×2 IMPLANT
CUFF TOURN SGL QUICK 18X4 (TOURNIQUET CUFF) IMPLANT
CUFF TOURN SGL QUICK 24 (TOURNIQUET CUFF)
CUFF TOURN SGL QUICK 34 (TOURNIQUET CUFF)
CUFF TRNQT CYL 24X4X16.5-23 (TOURNIQUET CUFF) IMPLANT
CUFF TRNQT CYL 34X4.125X (TOURNIQUET CUFF) IMPLANT
DRAIN PENROSE 0.5X18 (DRAIN) ×1 IMPLANT
DRSG PAD ABDOMINAL 8X10 ST (GAUZE/BANDAGES/DRESSINGS) ×4 IMPLANT
DRSG VAC ATS SM SENSATRAC (GAUZE/BANDAGES/DRESSINGS) ×1 IMPLANT
ELECT REM PT RETURN 15FT ADLT (MISCELLANEOUS) ×2 IMPLANT
GAUZE SPONGE 4X4 12PLY STRL (GAUZE/BANDAGES/DRESSINGS) ×2 IMPLANT
GLOVE BIO SURGEON STRL SZ7 (GLOVE) ×2 IMPLANT
GLOVE ORTHO TXT STRL SZ7.5 (GLOVE) ×2 IMPLANT
GOWN STRL REUS W/ TWL XL LVL3 (GOWN DISPOSABLE) ×1 IMPLANT
GOWN STRL REUS W/TWL XL LVL3 (GOWN DISPOSABLE) ×2
HANDPIECE INTERPULSE COAX TIP (DISPOSABLE) ×2
KIT BASIN OR (CUSTOM PROCEDURE TRAY) ×2 IMPLANT
KIT TURNOVER KIT A (KITS) IMPLANT
MANIFOLD NEPTUNE II (INSTRUMENTS) ×2 IMPLANT
PACK ORTHO EXTREMITY (CUSTOM PROCEDURE TRAY) ×2 IMPLANT
PAD CAST 4YDX4 CTTN HI CHSV (CAST SUPPLIES) ×1 IMPLANT
PADDING CAST COTTON 4X4 STRL (CAST SUPPLIES)
PENCIL SMOKE EVACUATOR (MISCELLANEOUS) IMPLANT
PROTECTOR NERVE ULNAR (MISCELLANEOUS) ×2 IMPLANT
SET HNDPC FAN SPRY TIP SCT (DISPOSABLE) ×1 IMPLANT
SUT BONE WAX W31G (SUTURE) ×1 IMPLANT
SWAB COLLECTION DEVICE MRSA (MISCELLANEOUS) ×2 IMPLANT
SWAB CULTURE ESWAB REG 1ML (MISCELLANEOUS) ×3 IMPLANT
SYR CONTROL 10ML LL (SYRINGE) ×1 IMPLANT
TOWEL OR 17X26 10 PK STRL BLUE (TOWEL DISPOSABLE) ×4 IMPLANT

## 2022-02-09 NOTE — ED Notes (Signed)
Pt taken to OR.

## 2022-02-09 NOTE — Anesthesia Postprocedure Evaluation (Signed)
Anesthesia Post Note  Patient: [name]  Procedure(s) Performed: IRRIGATION AND DEBRIDEMENT KNEE (Left: Knee)     Patient location during evaluation: PACU Anesthesia Type: General Level of consciousness: awake and alert Pain management: pain level controlled Vital Signs Assessment: post-procedure vital signs reviewed and stable Respiratory status: spontaneous breathing, nonlabored ventilation and respiratory function stable Cardiovascular status: blood pressure returned to baseline and stable Postop Assessment: no apparent nausea or vomiting Anesthetic complications: no   No notable events documented.  Last Vitals:  Vitals:   02/09/22 1850 02/09/22 1900  BP: (!) 142/81   Pulse: 87   Resp: 17 14  Temp: 37 C   SpO2: 100%     Last Pain:  Vitals:   02/09/22 1430  TempSrc:   PainSc: 5                  Lowella Curb

## 2022-02-09 NOTE — ED Notes (Signed)
Pt not wanting an MRI at present time, Abby PA notified and will speak with pt.

## 2022-02-09 NOTE — H&P (Signed)
Chief Complaint: Left knee infection History: [name] is a 51 y.o. male With a past medical history of recurrent bursitis of the left knee.  In the past he has been able to drain it states that has been purulent.  He has had worsening progressive pain and swelling in the left anterior knee For the past 1 week.  He was seen by PCP who gave him doxycycline. He has had 3 doses without relief. Pain is present over the entire shin. He has swelling in the leg when he stands no fever.  Review of systems: No recent fevers, chills, nausea or vomiting.  Past Medical History:  Diagnosis Date   Arthritis    hands    GERD (gastroesophageal reflux disease)    percocet causes reflux issues     No Known Allergies  No current facility-administered medications on file prior to encounter.   Current Outpatient Medications on File Prior to Encounter  Medication Sig Dispense Refill   albuterol (VENTOLIN HFA) 108 (90 Base) MCG/ACT inhaler Inhale 1-2 puffs into the lungs every 6 (six) hours as needed for wheezing or shortness of breath. 8 g 0   amphetamine-dextroamphetamine (ADDERALL) 20 MG tablet Take 20 mg by mouth daily as needed.     azithromycin (ZITHROMAX) 250 MG tablet Take 1 tablet (250 mg total) by mouth daily. Take first 2 tablets together, then 1 every day until finished. 6 tablet 0   gabapentin (NEURONTIN) 100 MG capsule Take 400 mg by mouth 4 (four) times daily.      gabapentin (NEURONTIN) 800 MG tablet Take 800 mg by mouth every 8 (eight) hours.     ketorolac (TORADOL) 10 MG tablet Take 10 mg by mouth every 4 (four) hours as needed.     Oxycodone HCl 10 MG TABS Take 10 mg by mouth every 4 (four) hours as needed.     predniSONE (DELTASONE) 50 MG tablet Take 1 tablet (50 mg total) by mouth daily with breakfast. 5 tablet 0    Physical Exam: Vitals:   02/09/22 1147 02/09/22 1335  BP: 137/86 134/63  Pulse: 76 65  Resp: 15 16  Temp: 98.1 F (36.7 C) 97.9 F (36.6 C)   SpO2: 98% 100%   Body mass index is 25.1 kg/m. He is alert and oriented x3. No shortness of breath or chest pain.  Lungs clear to auscultation.   Cardiac: Regular rate and rhythm no rubs gallops murmurs Abdomen: Soft and nontender.  No loss of bowel or bladder control. Patient is ambulating with no significant knee pain.  He has excellent range of motion of the hip, knee, ankle with no pain or swelling.  There is significant swelling over the anterior aspect of the left knee in the prepatellar bursal region. Distal neurovascular exam is intact with 5/5 EHL/tibialis anterior/gastrocnemius strength.  2+ dorsalis pedis/posterior tibialis pulses bilaterally.  Compartments are soft and nontender.  Image: MR KNEE LEFT WO CONTRAST  Result Date: 02/09/2022 CLINICAL DATA:  History of recurrent left knee bursitis. Soft tissue infection suspected, knee, xray done EXAM: MRI OF THE LEFT KNEE WITHOUT CONTRAST TECHNIQUE: Multiplanar, multisequence MR imaging of the knee was performed. No intravenous contrast was administered. COMPARISON:  None Available. FINDINGS: MENISCI Medial meniscus:  Intact. Lateral meniscus:  Intact. LIGAMENTS Cruciates: Intact ACL with mild mucoid degeneration.  Intact PCL. Collaterals: Intact MCL. Lateral collateral ligament complex intact. CARTILAGE Patellofemoral:  No chondral defect. Medial:  No chondral defect. Lateral:  No chondral  defect. MISCELLANEOUS Joint: No joint effusion. Trace fluid along the deep margin of the mid to distal patellar tendon. No significant deep infrapatellar bursal fluid collection. No fat pad edema. Popliteal Fossa:  No Baker's cyst. Intact popliteus tendon. Extensor Mechanism:  Intact quadriceps and patellar tendons. Bones: No acute fracture. No dislocation. No bone marrow edema. No erosion. No suspicious marrow replacing bone lesion. 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 Other: Complex  fluid collection within the infrapatellar soft tissues measuring 4.5 x 1.7 x 4.3 cm. Surrounding prepatellar edema. Normal muscle bulk and signal intensity. IMPRESSION: 1. Complex fluid collection within the infrapatellar soft tissues measuring 4.5 x 1.7 x 4.3 cm with surrounding prepatellar edema. Given the history, findings favored to represent septic prepatellar bursitis versus abscess. Hematoma could have a similar appearance in the appropriate clinical setting. 2. No evidence to suggest septic arthritis. No evidence of osteomyelitis. 3. No internal derangement of the left knee. Electronically Signed   By: Davina Poke D.O.   On: 02/09/2022 17:11    A/P: Erubey is a very pleasant 51 year old gentleman who has had significant left knee swelling in the prepatellar bursa region.  He was started on oral antibiotics 2 days ago and had persistent swelling and so presented to the ER today.  Clinical exam demonstrated significant swelling and fluctuance in the prepatellar region.  No pain with range of motion of the knee.  Aspiration done by the emergency room staff demonstrated frank purulent material.  As result I was consulted to see him.  The MRI of his left knee demonstrated the fluid collection but no evidence of a septic process in the knee joint itself.  There is also no evidence of osteomyelitis.  As result of the significant prepatellar bursa and the frank purulent material we have elected to move forward with a surgical irrigation and debridement and packing of the wound with a VAC.  Patient will receive an IV antibiotic dose and then continue oral antibiotics.  We will arrange infectious disease consultation to determine the best antibiotic treatment plan for him.  I have reviewed the risks and benefits of surgery with the patient which include infection, bleeding, nerve damage, death, stroke, paralysis, need for additional surgical treatment, worsening pain.  All of his questions were  addressed.  Consent was obtained.

## 2022-02-09 NOTE — ED Notes (Signed)
Pt waiting for MRI at present time

## 2022-02-09 NOTE — Op Note (Signed)
OPERATIVE REPORT  DATE OF SURGERY: 02/09/2022  PATIENT NAME:  [name] MRN: [id number] DOB: 06-21-1971  PCP: Kaleen Mask, MD  PRE-OPERATIVE DIAGNOSIS: Left infected prepatellar bursitis  POST-OPERATIVE DIAGNOSIS: Same  PROCEDURE:   I&D of left patella bursitis  SURGEON:  Venita Lick, MD  PHYSICIAN ASSISTANT: None  ANESTHESIA:   General  EBL: Minimal  Complications: None  Implants: Praveena wound VAC  BRIEF HISTORY: [name] is a 51 y.o. male who presented with swelling and tenderness over the left patella bursa.  Aspiration yielded purulent material.  As result I was consulted.  Patient was brought to the operating room for formal I&D of his left infected prepatellar bursitis.  PROCEDURE DETAILS: Patient was brought into the operating room and was properly positioned on the operating room table.  After induction with general anesthesia an LMA was placed. A timeout was taken to confirm all important data: including patient, procedure, and side.  The prepatellar bursitis was incised and frank purulent material was expressed.  Intraoperative cultures were obtained.  The wound was debrided and the bursa was excised.  Small foreign body was removed.  The wound was then debrided and irrigated with 9 L of pulse lavage.  At the conclusion of the lavage there was no further purulent material that could be expressed.  The Praveena sponge was then inserted and the device was connected up and functioning.  Patient was ultimately extubated and transferred the PACU without incident.  The end of the case all needle sponge counts were correct.  There were no adverse intraoperative events.  Venita Lick, MD 02/09/2022 6:38 PM

## 2022-02-09 NOTE — ED Triage Notes (Signed)
Abscess on left knee, area warm to touch

## 2022-02-09 NOTE — ED Provider Notes (Signed)
[facility]-EMERGENCY DEPT Provider Note   CSN: [id number] Arrival date & time: 02/09/22  1131     History  Chief Complaint  Patient presents with   Abscess    [name] is a 51 y.o. male With a past medical history of recurrent bursitis of the left knee.  In the past he has been able to drain it states that has been purulent.  He has had worsening progressive pain and swelling in the left anterior knee For the past 1 week.  He was seen by PCP who gave him doxycycline. He has had 3 doses without relief. Pain is present over the entire shin. He has swelling in the leg when he stands no fever.   Abscess     Home Medications Prior to Admission medications   Medication Sig Start Date End Date Taking? Authorizing Provider  albuterol (VENTOLIN HFA) 108 (90 Base) MCG/ACT inhaler Inhale 1-2 puffs into the lungs every 6 (six) hours as needed for wheezing or shortness of breath. 03/18/20   Cathie Hoops, Amy V, PA-C  amphetamine-dextroamphetamine (ADDERALL) 20 MG tablet Take 20 mg by mouth daily as needed. 01/05/20   [provider]  azithromycin (ZITHROMAX) 250 MG tablet Take 1 tablet (250 mg total) by mouth daily. Take first 2 tablets together, then 1 every day until finished. 03/18/20   Cathie Hoops, Amy V, PA-C  gabapentin (NEURONTIN) 100 MG capsule Take 400 mg by mouth 4 (four) times daily.     [provider]  gabapentin (NEURONTIN) 800 MG tablet Take 800 mg by mouth every 8 (eight) hours. 02/12/20   [provider]  ketorolac (TORADOL) 10 MG tablet Take 10 mg by mouth every 4 (four) hours as needed. 12/22/19   [provider]  Oxycodone HCl 10 MG TABS Take 10 mg by mouth every 4 (four) hours as needed. 12/26/19   [provider]  predniSONE (DELTASONE) 50 MG tablet Take 1 tablet (50 mg total) by mouth daily with breakfast. 03/18/20   Belinda Fisher, PA-C      Allergies    Patient has no known allergies.    Review of Systems   Review of  Systems  Physical Exam Updated Vital Signs BP 137/86 (BP Location: Right Arm)   Pulse 76   Temp 98.1 F (36.7 C) (Oral)   Resp 15   Ht 5\' 9"  (1.753 m)   Wt 77.1 kg   SpO2 98%   BMI 25.10 kg/m  Physical Exam Vitals and nursing note reviewed.  Constitutional:      General: He is not in acute distress.    Appearance: He is well-developed. He is not diaphoretic.  HENT:     Head: Normocephalic and atraumatic.  Eyes:     General: No scleral icterus.    Conjunctiva/sclera: Conjunctivae normal.  Cardiovascular:     Rate and Rhythm: Normal rate and regular rhythm.     Heart sounds: Normal heart sounds.  Pulmonary:     Effort: Pulmonary effort is normal. No respiratory distress.     Breath sounds: Normal breath sounds.  Abdominal:     Palpations: Abdomen is soft.     Tenderness: There is no abdominal tenderness.  Musculoskeletal:     Cervical back: Normal range of motion and neck supple.     Comments: Left knee with prepatellar swelling, fluctuance, warmth, erythema, and TTp. Able to move the knee joint.  Skin:    General: Skin is warm and dry.  Neurological:  Mental Status: He is alert.  Psychiatric:        Behavior: Behavior normal.       ED Results / Procedures / Treatments   Labs (all labs ordered are listed, but only abnormal results are displayed) Labs Reviewed  BODY FLUID CULTURE W GRAM STAIN  GRAM STAIN  SYNOVIAL CELL COUNT + DIFF, W/ CRYSTALS    EKG None  Radiology No results found.  Procedures .Marland KitchenIncision and Drainage  Date/Time: 02/09/2022 1:13 PM Performed by: Arthor Captain, PA-C Authorized by: Arthor Captain, PA-C   Consent:    Consent obtained:  Verbal   Consent given by:  Patient   Risks discussed:  Bleeding, incomplete drainage, pain and damage to other organs   Alternatives discussed:  No treatment Universal protocol:    Procedure explained and questions answered to patient or proxy's satisfaction: yes     Relevant documents  present and verified: yes     Test results available : yes     Imaging studies available: yes     Required blood products, implants, devices, and special equipment available: yes     Site/side marked: yes     Immediately prior to procedure, a time out was called: yes     Patient identity confirmed:  Verbally with patient Location:    Type:  Bursa   Size:  7 cm   Location:  Lower extremity   Lower extremity location:  Knee   Knee location:  L knee Pre-procedure details:    Skin preparation:  Betadine and chlorhexidine Anesthesia:    Anesthesia method:  Local infiltration   Local anesthetic:  Lidocaine 1% w/o epi Procedure type:    Complexity:  Complex Procedure details:    Incision type: Aspiration.   Incision depth:  Subcutaneous   Drainage:  Purulent and bloody   Drainage amount:  Copious (76ml) Post-procedure details:    Procedure completion:  Tolerated well, no immediate complications    Medications Ordered in ED Medications  lidocaine-EPINEPHrine (XYLOCAINE W/EPI) 2 %-1:100000 (with pres) injection 20 mL (20 mLs Intradermal Not Given 02/09/22 1233)  lidocaine (PF) (XYLOCAINE) 1 % injection (30 mLs  Given 02/09/22 1233)    ED Course/ Medical Decision Making/ A&P Clinical Course as of 02/09/22 1557  Fri Feb 09, 2022  1258 Case discussed with DR. Brooks, recommends MRI, NPO incase patient needs wash out.  Patient updated and okay with plan [AH]  1330 Patient refusing MRI- will consult with Dr. Shon Baton again. [AH]  1352 Patient has changed his mind. I have re-ordered the imaging. [AH]  1557 Basic metabolic panel(!) [AH]  1557 CBC(!) [AH]  1557 Cell count + diff,  w/ cryst-synvl fld(!) [AH]    Clinical Course User Index [AH] Arthor Captain, PA-C                           Medical Decision Making Patient here with knee pain.  He is currently awaiting MRI and consult from orthopedics. Labs show infected bursal fluid.  Gram stain is positive for gram-positive cocci in  chains suggestive of staph infection.  Patient is currently on doxycycline. Patient awaiting MRI.  Signout given to PA Smoot at shift handoff  Amount and/or Complexity of Data Reviewed Labs: ordered. Radiology: ordered.  Risk Prescription drug management.    Final Clinical Impression(s) / ED Diagnoses Final diagnoses:  None    Rx / DC Orders ED Discharge Orders     None  Arthor CaptainHarris, Lizzet Hendley, PA-C 02/09/22 1559    Linwood DibblesKnapp, Jon, MD 02/12/22 [phone]

## 2022-02-09 NOTE — Transfer of Care (Signed)
Immediate Anesthesia Transfer of Care Note  Patient: [name]  Procedure(s) Performed: IRRIGATION AND DEBRIDEMENT KNEE (Left: Knee)  Patient Location: PACU  Anesthesia Type:General  Level of Consciousness: awake, alert  and oriented  Airway & Oxygen Therapy: Patient Spontanous Breathing and Patient connected to face mask  Post-op Assessment: Report given to RN and Post -op Vital signs reviewed and stable  Post vital signs: Reviewed and stable  Last Vitals:  Vitals Value Taken Time  BP    Temp    Pulse    Resp    SpO2      Last Pain:  Vitals:   02/09/22 1430  TempSrc:   PainSc: 5          Complications: No notable events documented.

## 2022-02-09 NOTE — ED Provider Notes (Signed)
Care handoff from [place], PA-C at shift change. Please see their note for further information.   Briefly: Patient presents today with infected bursitis in his left knee. He was originally seen by PCP for same and placed on doxycycline which he is still taking. Denies fevers.  Plan: Same drained in the ER, productive of red turbid fluid with 219,090 WBC, 96% neutrophils. Gram stain with abundant gram positive cocci in chains. MRI knee left without contrast pending at shift change. PA Harris spoke to Dr. Shon Baton who states he will come see the patient and determine dispo.  MRI knee reveals  1. Complex fluid collection within the infrapatellar soft tissues measuring 4.5 x 1.7 x 4.3 cm with surrounding prepatellar edema. Given the history, findings favored to represent septic prepatellar bursitis versus abscess. Hematoma could have a similar appearance in the appropriate clinical setting. 2. No evidence to suggest septic arthritis. No evidence of osteomyelitis. 3. No internal derangement of the left knee.  I have personally reviewed and evaluated this imaging and agree with radiology interpretation.  Orthopedic surgery on call Dr. Shon Baton has seen the patient and decision made to give IV antibiotics and perform washout of the knee in the OR today and d/c with wound vac. Patient understanding and amenable with plan.   Vear Clock 02/09/22 1740    Glendora Score, MD 02/10/22 [phone]

## 2022-02-09 NOTE — Anesthesia Procedure Notes (Signed)
Procedure Name: LMA Insertion Date/Time: 02/09/2022 6:03 PM Performed by: Cynda Familia, CRNA Pre-anesthesia Checklist: Patient identified, Emergency Drugs available, Suction available and Patient being monitored Patient Re-evaluated:Patient Re-evaluated prior to induction Oxygen Delivery Method: Circle System Utilized Preoxygenation: Pre-oxygenation with 100% oxygen Induction Type: IV induction Ventilation: Mask ventilation without difficulty LMA: LMA inserted and LMA with gastric port inserted LMA Size: 4.0 Number of attempts: 1 Airway Equipment and Method: Bite block Placement Confirmation: positive ETCO2 Tube secured with: Tape Dental Injury: Teeth and Oropharynx as per pre-operative assessment  Comments: Smooth IV induction Miller-- LMA insertion  AM CRNA-- atraumatic-- teeth and mouth as preop many missing teeth and very poor dentition- no upper teeth-- bilat BS

## 2022-02-09 NOTE — ED Notes (Signed)
Abundant gram - rods. Dr Shon Baton notified via pre op nurse.

## 2022-02-09 NOTE — Brief Op Note (Signed)
02/09/2022  6:41 PM  PATIENT:  [name]  51 y.o. male  PRE-OPERATIVE DIAGNOSIS:  LEFT PATELLA BURSITIS  POST-OPERATIVE DIAGNOSIS:  LEFT PATELLA BURSITIS  PROCEDURE:  Procedure(s): IRRIGATION AND DEBRIDEMENT KNEE (Left)  SURGEON:  Surgeon(s) and Role:    Venita Lick, MD - Primary  PHYSICIAN ASSISTANT:   ASSISTANTS: none   ANESTHESIA:   general  EBL:  minimal   BLOOD ADMINISTERED:none  DRAINS: Wound VAC  LOCAL MEDICATIONS USED:  NONE  SPECIMEN:  Source of Specimen:  patellar bursa  DISPOSITION OF SPECIMEN:   Micro  COUNTS:  YES  TOURNIQUET:  * Missing tourniquet times found for documented tourniquets in log: [id number] *  DICTATION: .Dragon Dictation  PLAN OF CARE: Discharge to home after PACU  PATIENT DISPOSITION:  PACU - hemodynamically stable.

## 2022-02-09 NOTE — Anesthesia Preprocedure Evaluation (Addendum)
Anesthesia Evaluation  Patient identified by MRN, date of birth, ID band Patient awake    Reviewed: Allergy & Precautions, H&P , NPO status , Patient's Chart, lab work & pertinent test results  Airway Mallampati: II  TM Distance: >3 FB Neck ROM: Full    Dental  (+) Edentulous Upper   Pulmonary neg pulmonary ROS, former smoker,    Pulmonary exam normal breath sounds clear to auscultation       Cardiovascular negative cardio ROS Normal cardiovascular exam Rhythm:Regular Rate:Normal     Neuro/Psych negative neurological ROS  negative psych ROS   GI/Hepatic Neg liver ROS, GERD  ,  Endo/Other  negative endocrine ROS  Renal/GU negative Renal ROS  negative genitourinary   Musculoskeletal  (+) Arthritis , Osteoarthritis,    Abdominal   Peds negative pediatric ROS (+)  Hematology negative hematology ROS (+)   Anesthesia Other Findings   Reproductive/Obstetrics negative OB ROS                            Anesthesia Physical  Anesthesia Plan  ASA: II and emergent  Anesthesia Plan: General   Post-op Pain Management: Dilaudid IV   Induction: Intravenous  PONV Risk Score and Plan: 2 and Ondansetron, Midazolam and Treatment may vary due to age or medical condition  Airway Management Planned: LMA  Additional Equipment:   Intra-op Plan:   Post-operative Plan: Extubation in OR  Informed Consent: I have reviewed the patients History and Physical, chart, labs and discussed the procedure including the risks, benefits and alternatives for the proposed anesthesia with the patient or authorized representative who has indicated his/her understanding and acceptance.     Dental advisory given  Plan Discussed with: CRNA  Anesthesia Plan Comments:         Anesthesia Quick Evaluation

## 2022-02-10 ENCOUNTER — Emergency Department (HOSPITAL_COMMUNITY)
Admission: EM | Admit: 2022-02-10 | Discharge: 2022-02-10 | Disposition: A | Discharge status: Home / Self Care | Payer: No Typology Code available for payment source | Attending: Emergency Medicine | Admitting: Emergency Medicine

## 2022-02-10 ENCOUNTER — Other Ambulatory Visit: Payer: Self-pay

## 2022-02-10 ENCOUNTER — Encounter (HOSPITAL_COMMUNITY): Payer: Self-pay | Admitting: Orthopedic Surgery

## 2022-02-10 DIAGNOSIS — Z4689 Encounter for fitting and adjustment of other specified devices: Secondary | ICD-10-CM | POA: Diagnosis not present

## 2022-02-10 DIAGNOSIS — M25562 Pain in left knee: Secondary | ICD-10-CM | POA: Diagnosis present

## 2022-02-10 DIAGNOSIS — M71162 Other infective bursitis, left knee: Secondary | ICD-10-CM | POA: Diagnosis not present

## 2022-02-10 MED ORDER — ONDANSETRON 8 MG PO TBDP
8.0000 mg | ORAL_TABLET | Freq: Once | ORAL | Status: AC
  Administered 2022-02-10: 8 mg via ORAL
  Filled 2022-02-10: qty 1

## 2022-02-10 MED ORDER — OXYCODONE-ACETAMINOPHEN 5-325 MG PO TABS
1.0000 | ORAL_TABLET | Freq: Once | ORAL | Status: AC
  Administered 2022-02-10: 1 via ORAL
  Filled 2022-02-10: qty 1

## 2022-02-10 MED ORDER — LIDOCAINE HCL (PF) 1 % IJ SOLN
30.0000 mL | Freq: Once | INTRAMUSCULAR | Status: DC
  Filled 2022-02-10: qty 30

## 2022-02-10 MED ORDER — LIDOCAINE-EPINEPHRINE 2 %-1:100000 IJ SOLN
20.0000 mL | Freq: Once | INTRAMUSCULAR | Status: AC
  Administered 2022-02-10: 20 mL via INTRADERMAL
  Filled 2022-02-10: qty 1

## 2022-02-10 NOTE — ED Notes (Signed)
Portables and materials will bring suppplies

## 2022-02-10 NOTE — Discharge Instructions (Signed)
[name] put on the wound vac today in the ED. Follow up with Dr. Shon Baton Monday as planned.  Continue taking your antibiotics.  Return to the ED if you have fevers, vomiting or concerning symptoms.

## 2022-02-10 NOTE — Procedures (Signed)
Procedure Consent  Patient seen per Emergency Department request for assessment and suggestions for care of malfunctioning wound vac s/p I&D for left knee prepatellar septic bursitis (DOS 02/09/22).  Wound measures 5cm x 3cm x 1cm. Wound bed is healthy. Periwound skin is normal, no edema, no exudate, no odor noted. Patient does not complain of pain.  Recommendations include exchange of the left knee wound vac.  Verbal consent was obtained and procedure carried out after verbal timeout.  Prior NPWT dressing had been removed by ER Cleansed left knee wound with normal saline by ER Periwound skin protected with skin barrier wipe or window framed with drape Filled wound with single piece of black foam Sealed NPWT dressing at Hg Patient received pain medication per bedside nurse prior to dressing change Patient tolerated procedure well  Netta Cedars, MD Orthopaedic Surgery [facility]

## 2022-02-10 NOTE — ED Provider Notes (Signed)
Irrigation and debridement  Date/Time: 02/10/2022 3:33 PM Performed by: Glendora Score, MD Authorized by: Glendora Score, MD  Consent: Verbal consent obtained. Consent given by: patient Patient understanding: patient states understanding of the procedure being performed Patient identity confirmed: verbally with patient Preparation: Patient was prepped and draped in the usual sterile fashion. Local anesthesia used: yes Anesthesia: local infiltration  Anesthesia: Local anesthesia used: yes Local Anesthetic: lidocaine 2% with epinephrine Anesthetic total: 8 mL  Sedation: Patient sedated: no  Patient tolerance: patient tolerated the procedure well with no immediate complications Comments: Wound copiously irrigated with 2 L of sterile saline.  Large amount of hematoma removed from wound.  Bleeding controlled with local injection of lidocaine with epinephrine.      Glendora Score, MD 02/10/22 1534

## 2022-02-10 NOTE — Consult Note (Deleted)
Patient ID: [name] MRN: [id number] DOB/AGE: Sep 04, 1971 51 y.o.  Admit date: 02/10/2022 Service Time: 4:30 PM  Admission Diagnoses:  Left knee prepatellar septic bursitis  HPI: Ortho consult for malfunctioning wound vac s/p left knee prepatellar bursa I&D on 02/09/22 by Dr. Shon Baton. The patient was discharged home on antibiotics but returned to the ER today due to wound vac alarm. He denies numbness/tingling. Denies fever/chills. Denies any new pain and is otherwise stable. Ortho was requested to evaluate and perform wound vac exchange as ER felt uncomfortable doing so on their attempt. At the time of bedside evaluation, the prior wound vac had already been removed and the wound irrigated by ER with 2 L of normal saline.  Past Medical History: Past Medical History:  Diagnosis Date   Arthritis    hands    GERD (gastroesophageal reflux disease)    percocet causes reflux issues     Surgical History: Past Surgical History:  Procedure Laterality Date   HERNIA REPAIR     INGUINAL HERNIA REPAIR  08/06/2011   Procedure: HERNIA REPAIR INGUINAL ADULT;  Surgeon: Wilmon Arms. Corliss Skains, MD;  Location: [facility];  Service: General;  Laterality: Right;   IRRIGATION AND DEBRIDEMENT KNEE Left 02/09/2022   Procedure: IRRIGATION AND DEBRIDEMENT KNEE;  Surgeon: Venita Lick, MD;  Location: [facility];  Service: Orthopedics;  Laterality: Left;    Family History: Family History  Problem Relation Age of Onset   Cancer Paternal Aunt    Prostate cancer Neg Hx    Bladder Cancer Neg Hx    Kidney cancer Neg Hx     Social History: Social History   Socioeconomic History   Marital status: Married    Spouse name: Not on file   Number of children: Not on file   Years of education: Not on file   Highest education level: Not on file  Occupational History   Not on file  Tobacco Use   Smoking status: Former    Packs/day: 1.00    Years: 20.00    Pack years: 20.00    Types: Cigarettes    Smokeless tobacco: Former  Building services engineer Use: Some days  Substance and Sexual Activity   Alcohol use: No   Drug use: No   Sexual activity: Not on file  Other Topics Concern   Not on file  Social History Narrative   Not on file   Social Determinants of Health   Financial Resource Strain: Not on file  Food Insecurity: Not on file  Transportation Needs: Not on file  Physical Activity: Not on file  Stress: Not on file  Social Connections: Not on file  Intimate Partner Violence: Not on file    Allergies: Patient has no known allergies.  Medications: I have reviewed the patient's current medications.  Vital Signs: Patient Vitals for the past 24 hrs:  BP Temp Pulse Resp SpO2  02/09/22 2020 130/75 98.6 F (37 C) 74 18 100 %  02/09/22 1945 (!) 156/86 98.2 F (36.8 C) 77 17 98 %  02/09/22 1930 129/90 -- 82 19 96 %  02/09/22 1915 (!) 159/89 -- 79 16 98 %  02/09/22 1900 (!) 141/89 -- 75 14 96 %  02/09/22 1850 (!) 142/81 98.6 F (37 C) 87 17 100 %    Radiology: MR KNEE LEFT WO CONTRAST  Result Date: 02/09/2022 CLINICAL DATA:  History of recurrent left knee bursitis. Soft tissue infection suspected, knee, xray done EXAM: MRI OF THE LEFT  KNEE WITHOUT CONTRAST TECHNIQUE: Multiplanar, multisequence MR imaging of the knee was performed. No intravenous contrast was administered. COMPARISON:  None Available. FINDINGS: MENISCI Medial meniscus:  Intact. Lateral meniscus:  Intact. LIGAMENTS Cruciates: Intact ACL with mild mucoid degeneration.  Intact PCL. Collaterals: Intact MCL. Lateral collateral ligament complex intact. CARTILAGE Patellofemoral:  No chondral defect. Medial:  No chondral defect. Lateral:  No chondral defect. MISCELLANEOUS Joint: No joint effusion. Trace fluid along the deep margin of the mid to distal patellar tendon. No significant deep infrapatellar bursal fluid collection. No fat pad edema. Popliteal Fossa:  No Baker's cyst. Intact popliteus tendon. Extensor  Mechanism:  Intact quadriceps and patellar tendons. Bones: No acute fracture. No dislocation. No bone marrow edema. No erosion. No suspicious marrow replacing bone lesion. 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 Other: Complex fluid collection within the infrapatellar soft tissues measuring 4.5 x 1.7 x 4.3 cm. Surrounding prepatellar edema. Normal muscle bulk and signal intensity. IMPRESSION: 1. Complex fluid collection within the infrapatellar soft tissues measuring 4.5 x 1.7 x 4.3 cm with surrounding prepatellar edema. Given the history, findings favored to represent septic prepatellar bursitis versus abscess. Hematoma could have a similar appearance in the appropriate clinical setting. 2. No evidence to suggest septic arthritis. No evidence of osteomyelitis. 3. No internal derangement of the left knee. Electronically Signed   By: Davina Poke D.O.   On: 02/09/2022 17:11    Labs: Recent Labs    02/09/22 1257  WBC 11.0*  RBC 4.50  HCT 42.3  PLT 275   Recent Labs    02/09/22 1257  NA 137  K 3.5  CL 102  CO2 28  BUN 13  CREATININE 0.68  GLUCOSE 105*  CALCIUM 9.1   No results for input(s): LABPT, INR in the last 72 hours.  Review of Systems: ROS as detailed in HPI  Physical Exam: Body mass index is 25.1 kg/m.  Gen: AAOx3, NAD Comfortable at rest  Left Lower Extremity: 5 x 3 x 1 cm wound anterior knee with no active bleeding, no purulence and healthy appearing wound bed No surrounding erythema/induration Skin intact otherwise HF/KE/KF/ADF/APF/EHL 5/5 SILT throughout DP, PT 2+ to palp CR < 2s   Assessment and Plan: Ortho consult for malfunctioning wound vac s/p left knee prepatellar bursa I&D on 02/09/22 by Dr. Rolena Infante  -history, exam and imaging reviewed at length with the patient -wound vac exchange performed at bedside with no issues and maintained seal (refer separate procedure note) -patient  advised to continue antibiotics as prescribed and monitor left knee for red flag signs of worsening infection -WBAT LLE with knee immobilizer for comfort -maintain follow up in Dr. Charmayne Sheer office on Monday as scheduled. Patient will call our office if any issues in the interim  Armond Hang, MD Orthopaedic Surgeon [facility] [phone]

## 2022-02-10 NOTE — Consult Note (Signed)
Patient ID: [name] MRN: [id number] DOB/AGE: May 02, 1971 51 y.o.  Admit date: 02/10/2022 Service Time: 4:30 PM  Admission Diagnoses:  Left knee prepatellar septic bursitis  HPI: Ortho consult for malfunctioning wound vac s/p left knee prepatellar bursa I&D on 02/09/22 by Dr. Shon Baton. The patient was discharged home on antibiotics but returned to the ER today due to wound vac alarm. He denies numbness/tingling. Denies fever/chills. Denies any new pain and is otherwise stable. Ortho was requested to evaluate and perform wound vac exchange as ER felt uncomfortable doing so on their attempt. At the time of bedside evaluation, the prior wound vac had already been removed and the wound irrigated by ER with 2 L of normal saline.  Past Medical History: Past Medical History:  Diagnosis Date   Arthritis    hands    GERD (gastroesophageal reflux disease)    percocet causes reflux issues     Surgical History: Past Surgical History:  Procedure Laterality Date   HERNIA REPAIR     INGUINAL HERNIA REPAIR  08/06/2011   Procedure: HERNIA REPAIR INGUINAL ADULT;  Surgeon: Wilmon Arms. Corliss Skains, MD;  Location: [facility];  Service: General;  Laterality: Right;   IRRIGATION AND DEBRIDEMENT KNEE Left 02/09/2022   Procedure: IRRIGATION AND DEBRIDEMENT KNEE;  Surgeon: Venita Lick, MD;  Location: [facility];  Service: Orthopedics;  Laterality: Left;    Family History: Family History  Problem Relation Age of Onset   Cancer Paternal Aunt    Prostate cancer Neg Hx    Bladder Cancer Neg Hx    Kidney cancer Neg Hx     Social History: Social History   Socioeconomic History   Marital status: Married    Spouse name: Not on file   Number of children: Not on file   Years of education: Not on file   Highest education level: Not on file  Occupational History   Not on file  Tobacco Use   Smoking status: Former    Packs/day: 1.00    Years: 20.00    Pack years: 20.00    Types: Cigarettes    Smokeless tobacco: Former  Building services engineer Use: Some days  Substance and Sexual Activity   Alcohol use: No   Drug use: No   Sexual activity: Not on file  Other Topics Concern   Not on file  Social History Narrative   Not on file   Social Determinants of Health   Financial Resource Strain: Not on file  Food Insecurity: Not on file  Transportation Needs: Not on file  Physical Activity: Not on file  Stress: Not on file  Social Connections: Not on file  Intimate Partner Violence: Not on file    Allergies: Patient has no known allergies.  Medications: I have reviewed the patient's current medications.  Vital Signs: Patient Vitals for the past 24 hrs:  BP Temp Temp src Pulse Resp SpO2 Height Weight  02/10/22 1702 118/80 -- -- 80 19 100 % -- --  02/10/22 1558 118/84 -- -- 80 16 100 % -- --  02/10/22 1213 (!) 118/94 98.7 F (37.1 C) Oral 80 16 100 % -- --  02/10/22 1208 -- -- -- -- -- -- 5\' 9"  (1.753 m) 76.2 kg     Radiology: MR KNEE LEFT WO CONTRAST  Result Date: 02/09/2022 CLINICAL DATA:  History of recurrent left knee bursitis. Soft tissue infection suspected, knee, xray done EXAM: MRI OF THE LEFT KNEE WITHOUT CONTRAST TECHNIQUE: Multiplanar, multisequence MR  imaging of the knee was performed. No intravenous contrast was administered. COMPARISON:  None Available. FINDINGS: MENISCI Medial meniscus:  Intact. Lateral meniscus:  Intact. LIGAMENTS Cruciates: Intact ACL with mild mucoid degeneration.  Intact PCL. Collaterals: Intact MCL. Lateral collateral ligament complex intact. CARTILAGE Patellofemoral:  No chondral defect. Medial:  No chondral defect. Lateral:  No chondral defect. MISCELLANEOUS Joint: No joint effusion. Trace fluid along the deep margin of the mid to distal patellar tendon. No significant deep infrapatellar bursal fluid collection. No fat pad edema. Popliteal Fossa:  No Baker's cyst. Intact popliteus tendon. Extensor Mechanism:  Intact quadriceps and patellar  tendons. Bones: No acute fracture. No dislocation. No bone marrow edema. No erosion. No suspicious marrow replacing bone lesion. 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 Other: Complex fluid collection within the infrapatellar soft tissues measuring 4.5 x 1.7 x 4.3 cm. Surrounding prepatellar edema. Normal muscle bulk and signal intensity. IMPRESSION: 1. Complex fluid collection within the infrapatellar soft tissues measuring 4.5 x 1.7 x 4.3 cm with surrounding prepatellar edema. Given the history, findings favored to represent septic prepatellar bursitis versus abscess. Hematoma could have a similar appearance in the appropriate clinical setting. 2. No evidence to suggest septic arthritis. No evidence of osteomyelitis. 3. No internal derangement of the left knee. Electronically Signed   By: Duanne Guess D.O.   On: 02/09/2022 17:11    Labs: Recent Labs    02/09/22 1257  WBC 11.0*  RBC 4.50  HCT 42.3  PLT 275    Recent Labs    02/09/22 1257  NA 137  K 3.5  CL 102  CO2 28  BUN 13  CREATININE 0.68  GLUCOSE 105*  CALCIUM 9.1    No results for input(s): LABPT, INR in the last 72 hours.  Review of Systems: ROS as detailed in HPI  Physical Exam: Body mass index is 24.81 kg/m.  Gen: AAOx3, NAD Comfortable at rest  Left Lower Extremity: 5 x 3 x 1 cm wound anterior knee with no active bleeding, no purulence and healthy appearing wound bed No surrounding erythema/induration Skin intact otherwise HF/KE/KF/ADF/APF/EHL 5/5 SILT throughout DP, PT 2+ to palp CR < 2s   Assessment and Plan: Ortho consult for malfunctioning wound vac s/p left knee prepatellar bursa I&D on 02/09/22 by Dr. Shon Baton  -history, exam and imaging reviewed at length with the patient -wound vac exchange performed at bedside with no issues and maintained seal (refer separate procedure note) -patient advised to continue antibiotics as  prescribed and monitor left knee for red flag signs of worsening infection -WBAT LLE with knee immobilizer for comfort -maintain follow up in Dr. Gary Fleet office on Monday as scheduled. Patient will call our office if any issues in the interim  Netta Cedars, MD Orthopaedic Surgeon [facility] [phone]

## 2022-02-10 NOTE — ED Notes (Signed)
Removed wound vac. Large clot around incision with formed clot in actual wound. At this time Hailey PA was called to assist. Due to clot she will notify Surgery for advice. A wet NS dressing has been placed over incision.

## 2022-02-10 NOTE — ED Triage Notes (Signed)
Pt had wound vac placed to left knee last night, later the wound vac stopped working. Company trouble shot and feels it is a clot in the line.

## 2022-02-10 NOTE — ED Provider Notes (Signed)
[facility]-EMERGENCY DEPT Provider Note   CSN: [id number] Arrival date & time: 02/10/22  1201     History  Chief Complaint  Patient presents with   Knee Pain    AJA BOLANDER is a 51 y.o. male.   Knee Pain  Patient presents with left knee wound VAC issue.  Patient was seen in the ED yesterday and had a washout performed, was placed with a wound VAC.  The wound VAC worked at time of discharge but at 1 AM this morning there was bright red blood in the catheter.  Worried about a clot being stuck in the line.  No fevers, taking pain medicine at home.  Home Medications Prior to Admission medications   Medication Sig Start Date End Date Taking? Authorizing Provider  albuterol (VENTOLIN HFA) 108 (90 Base) MCG/ACT inhaler Inhale 1-2 puffs into the lungs every 6 (six) hours as needed for wheezing or shortness of breath. 03/18/20   Cathie Hoops, Amy V, PA-C  amphetamine-dextroamphetamine (ADDERALL) 20 MG tablet Take 20 mg by mouth daily as needed. 01/05/20   [provider]  cephALEXin (KEFLEX) 500 MG capsule Take 1 capsule (500 mg total) by mouth 4 (four) times daily for 14 days. 02/09/22 02/23/22  Venita Lick, MD  doxycycline (VIBRAMYCIN) 50 MG capsule Take 2 capsules (100 mg total) by mouth 2 (two) times daily for 14 days. 02/09/22 02/23/22  Venita Lick, MD  HYDROcodone-acetaminophen (NORCO) 10-325 MG tablet Take 1 tablet by mouth every 6 (six) hours as needed for up to 5 days. 02/09/22 02/14/22  Venita Lick, MD      Allergies    Patient has no known allergies.    Review of Systems   Review of Systems  Physical Exam Updated Vital Signs BP 118/80   Pulse 80   Temp 98.7 F (37.1 C) (Oral)   Resp 19   Ht 5\' 9"  (1.753 m)   Wt 76.2 kg   SpO2 100%   BMI 24.81 kg/m  Physical Exam Vitals and nursing note reviewed. Exam conducted with a chaperone present.  Constitutional:      General: He is not in acute distress.    Appearance: Normal appearance.   HENT:     Head: Normocephalic and atraumatic.  Eyes:     General: No scleral icterus.    Extraocular Movements: Extraocular movements intact.     Pupils: Pupils are equal, round, and reactive to light.  Cardiovascular:     Pulses: Normal pulses.  Skin:    Capillary Refill: Capillary refill takes less than 2 seconds.     Coloration: Skin is not jaundiced.     Comments: See photo  Neurological:     Mental Status: He is alert. Mental status is at baseline.     Coordination: Coordination normal.     ED Results / Procedures / Treatments   Labs (all labs ordered are listed, but only abnormal results are displayed) Labs Reviewed - No data to display  EKG None  Radiology MR KNEE LEFT WO CONTRAST  Result Date: 02/09/2022 CLINICAL DATA:  History of recurrent left knee bursitis. Soft tissue infection suspected, knee, xray done EXAM: MRI OF THE LEFT KNEE WITHOUT CONTRAST TECHNIQUE: Multiplanar, multisequence MR imaging of the knee was performed. No intravenous contrast was administered. COMPARISON:  None Available. FINDINGS: MENISCI Medial meniscus:  Intact. Lateral meniscus:  Intact. LIGAMENTS Cruciates: Intact ACL with mild mucoid degeneration.  Intact PCL. Collaterals: Intact MCL. Lateral collateral ligament complex intact. CARTILAGE Patellofemoral:  No chondral defect. Medial:  No chondral defect. Lateral:  No chondral defect. MISCELLANEOUS Joint: No joint effusion. Trace fluid along the deep margin of the mid to distal patellar tendon. No significant deep infrapatellar bursal fluid collection. No fat pad edema. Popliteal Fossa:  No Baker's cyst. Intact popliteus tendon. Extensor Mechanism:  Intact quadriceps and patellar tendons. Bones: No acute fracture. No dislocation. No bone marrow edema. No erosion. No suspicious marrow replacing bone lesion. 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 Other: Complex fluid collection within  the infrapatellar soft tissues measuring 4.5 x 1.7 x 4.3 cm. Surrounding prepatellar edema. Normal muscle bulk and signal intensity. IMPRESSION: 1. Complex fluid collection within the infrapatellar soft tissues measuring 4.5 x 1.7 x 4.3 cm with surrounding prepatellar edema. Given the history, findings favored to represent septic prepatellar bursitis versus abscess. Hematoma could have a similar appearance in the appropriate clinical setting. 2. No evidence to suggest septic arthritis. No evidence of osteomyelitis. 3. No internal derangement of the left knee. Electronically Signed   By: Duanne Guess D.O.   On: 02/09/2022 17:11    Procedures Procedures    Medications Ordered in ED Medications  oxyCODONE-acetaminophen (PERCOCET/ROXICET) 5-325 MG per tablet 1 tablet (1 tablet Oral Given 02/10/22 1311)  ondansetron (ZOFRAN-ODT) disintegrating tablet 8 mg (8 mg Oral Given 02/10/22 1311)  lidocaine-EPINEPHrine (XYLOCAINE W/EPI) 2 %-1:100000 (with pres) injection 20 mL (20 mLs Intradermal Given 02/10/22 1559)    ED Course/ Medical Decision Making/ A&P                           Medical Decision Making Risk Prescription drug management.   Patient presents with wounds to left knee following washout yesterday.    I spoke with the on-call orthopedic physician with [facility], they are familiar with the case and stated to attempt to change out the wound vac in the ED.   Patient's wound VAC was removed, nursing staff attempted to replace the wound VAC but they were unable to get the catheter past the large blood clot.  Photo was seen in the PE.  I will reconsult orthopedics for their recommendations.   I spoke with Dr. Odis Hollingshead who is on-call with [facility].  He is aware of the patient, states he will need to see Dr. Shon Baton on Monday.  Regarding the wound VAC change out he states he does not need to come in order to change this out when an ER nurse is able to change it out. I explained that we had  two nurses attempt but were unable to do this due to the clot.  He will send an orthopedic PA to the ED to help with wound VAC application. Appreciate the consult and help coordinating care for this patient.  Wound vac applied. Return precautions discussed. D/C in stable conditions.  Discussed HPI, physical exam and plan of care for this patient with attending [facility]. The attending physician evaluated this patient as part of a shared visit and agrees with plan of care.         Final Clinical Impression(s) / ED Diagnoses Final diagnoses:  Encounter for management of wound VAC    Rx / DC Orders ED Discharge Orders     None         Theron Arista, Cordelia Poche 02/10/22 1730    Kommor, Bryan, MD 02/11/22 [phone]

## 2022-02-11 LAB — NO BLOOD PRODUCTS

## 2022-02-12 LAB — AEROBIC/ANAEROBIC CULTURE W GRAM STAIN (SURGICAL/DEEP WOUND)

## 2022-02-12 LAB — BODY FLUID CULTURE W GRAM STAIN: Special Requests: NORMAL

## 2022-02-14 ENCOUNTER — Other Ambulatory Visit: Payer: Self-pay

## 2022-02-14 ENCOUNTER — Encounter (HOSPITAL_COMMUNITY): Payer: Self-pay | Admitting: Orthopedic Surgery

## 2022-02-14 ENCOUNTER — Ambulatory Visit: Payer: Self-pay | Admitting: Orthopedic Surgery

## 2022-02-14 NOTE — Anesthesia Preprocedure Evaluation (Signed)
Anesthesia Evaluation  Patient identified by MRN, date of birth, ID band Patient awake    Reviewed: Allergy & Precautions, H&P , NPO status , Patient's Chart, lab work & pertinent test results  Airway Mallampati: II  TM Distance: >3 FB Neck ROM: Full    Dental  (+) Edentulous Upper   Pulmonary neg pulmonary ROS, former smoker,    Pulmonary exam normal breath sounds clear to auscultation       Cardiovascular Exercise Tolerance: Good negative cardio ROS Normal cardiovascular exam Rhythm:Regular Rate:Normal     Neuro/Psych negative neurological ROS  negative psych ROS   GI/Hepatic Neg liver ROS, GERD  Medicated,  Endo/Other  negative endocrine ROS  Renal/GU negative Renal ROS  negative genitourinary   Musculoskeletal  (+) Arthritis , Osteoarthritis,    Abdominal   Peds negative pediatric ROS (+)  Hematology negative hematology ROS (+)   Anesthesia Other Findings   Reproductive/Obstetrics negative OB ROS                            Anesthesia Physical Anesthesia Plan  ASA: 2  Anesthesia Plan: General   Post-op Pain Management: Tylenol PO (pre-op)*   Induction: Intravenous  PONV Risk Score and Plan: 2 and Treatment may vary due to age or medical condition, Midazolam, Ondansetron and Dexamethasone  Airway Management Planned: LMA  Additional Equipment: None  Intra-op Plan:   Post-operative Plan: Extubation in OR  Informed Consent: I have reviewed the patients History and Physical, chart, labs and discussed the procedure including the risks, benefits and alternatives for the proposed anesthesia with the patient or authorized representative who has indicated his/her understanding and acceptance.     Dental advisory given  Plan Discussed with: Anesthesiologist, CRNA and Surgeon  Anesthesia Plan Comments:        Anesthesia Quick Evaluation

## 2022-02-14 NOTE — Progress Notes (Signed)
PCP - Dr. Shelah Lewandowsky  Cardiologist - Denies  EP- Denies  Endocrine- Denies  Pulm- Denies  Chest x-ray - Denies  EKG - Denies  Stress Test - Denies  ECHO - Denies  Cardiac Cath - Denies  AICD-na PM-na LOOP-na  Nerve Stimulator- Denies  Dialysis- Denies  Sleep Study - Denies  CPAP - Denies  LABS- 02/09/22(E): CBC. BMP  ASA- Denies  ERAS- No  HA1C- Denies  Anesthesia- No  Pt denies having chest pain, sob, or fever during the pre-op phone call. All instructions explained to the pt, with a verbal understanding of the material including: as of today, stop taking all Aspirin (unless instructed by your doctor) and Other Aspirin containing products, Vitamins, Fish oils, and Herbal medications. Also stop all NSAIDS i.e. Advil, Ibuprofen, Motrin, Aleve, Anaprox, Naproxen, BC, Goody Powders, and all Supplements.  Pt also instructed to wear a mask and social distance if he goes out. The opportunity to ask questions was provided.

## 2022-02-15 ENCOUNTER — Telehealth (INDEPENDENT_AMBULATORY_CARE_PROVIDER_SITE_OTHER): Payer: No Typology Code available for payment source | Admitting: Infectious Disease

## 2022-02-15 ENCOUNTER — Encounter (HOSPITAL_COMMUNITY): Payer: Self-pay | Admitting: Orthopedic Surgery

## 2022-02-15 ENCOUNTER — Ambulatory Visit (HOSPITAL_BASED_OUTPATIENT_CLINIC_OR_DEPARTMENT_OTHER): Payer: No Typology Code available for payment source | Admitting: Anesthesiology

## 2022-02-15 ENCOUNTER — Other Ambulatory Visit: Payer: Self-pay

## 2022-02-15 ENCOUNTER — Encounter (HOSPITAL_COMMUNITY)
Admission: RE | Disposition: A | Discharge status: Home / Self Care | Payer: Self-pay | Source: Home / Self Care | Attending: Orthopedic Surgery

## 2022-02-15 ENCOUNTER — Ambulatory Visit (HOSPITAL_COMMUNITY): Payer: No Typology Code available for payment source | Admitting: Anesthesiology

## 2022-02-15 ENCOUNTER — Ambulatory Visit (HOSPITAL_COMMUNITY)
Admission: RE | Admit: 2022-02-15 | Discharge: 2022-02-15 | Disposition: A | Discharge status: Home / Self Care | Payer: No Typology Code available for payment source | Attending: Orthopedic Surgery | Admitting: Orthopedic Surgery

## 2022-02-15 ENCOUNTER — Encounter: Payer: Self-pay | Admitting: Infectious Disease

## 2022-02-15 DIAGNOSIS — M1990 Unspecified osteoarthritis, unspecified site: Secondary | ICD-10-CM | POA: Diagnosis not present

## 2022-02-15 DIAGNOSIS — K219 Gastro-esophageal reflux disease without esophagitis: Secondary | ICD-10-CM | POA: Insufficient documentation

## 2022-02-15 DIAGNOSIS — M7042 Prepatellar bursitis, left knee: Secondary | ICD-10-CM | POA: Insufficient documentation

## 2022-02-15 DIAGNOSIS — M71169 Other infective bursitis, unspecified knee: Secondary | ICD-10-CM

## 2022-02-15 DIAGNOSIS — M71162 Other infective bursitis, left knee: Secondary | ICD-10-CM | POA: Diagnosis not present

## 2022-02-15 DIAGNOSIS — M7110 Other infective bursitis, unspecified site: Secondary | ICD-10-CM

## 2022-02-15 DIAGNOSIS — Z87891 Personal history of nicotine dependence: Secondary | ICD-10-CM | POA: Diagnosis not present

## 2022-02-15 HISTORY — PX: APPLICATION OF WOUND VAC: SHX5189

## 2022-02-15 HISTORY — DX: Other infective bursitis, unspecified knee: M71.169

## 2022-02-15 HISTORY — PX: IRRIGATION AND DEBRIDEMENT KNEE: SHX5185

## 2022-02-15 LAB — NO BLOOD PRODUCTS

## 2022-02-15 SURGERY — IRRIGATION AND DEBRIDEMENT KNEE
Anesthesia: General | Site: Knee | Laterality: Left

## 2022-02-15 MED ORDER — OXYCODONE HCL 5 MG PO TABS
5.0000 mg | ORAL_TABLET | Freq: Once | ORAL | Status: AC | PRN
  Administered 2022-02-15: 5 mg via ORAL

## 2022-02-15 MED ORDER — FENTANYL CITRATE (PF) 250 MCG/5ML IJ SOLN
INTRAMUSCULAR | Status: AC
  Filled 2022-02-15: qty 5

## 2022-02-15 MED ORDER — LACTATED RINGERS IV SOLN: INTRAVENOUS | Status: DC

## 2022-02-15 MED ORDER — FENTANYL CITRATE (PF) 100 MCG/2ML IJ SOLN
25.0000 ug | INTRAMUSCULAR | Status: DC | PRN
  Administered 2022-02-15: 50 ug via INTRAVENOUS

## 2022-02-15 MED ORDER — CHLORHEXIDINE GLUCONATE 0.12 % MT SOLN
15.0000 mL | Freq: Once | OROMUCOSAL | Status: AC
  Administered 2022-02-15: 15 mL via OROMUCOSAL
  Filled 2022-02-15: qty 15

## 2022-02-15 MED ORDER — VANCOMYCIN HCL 500 MG IV SOLR
INTRAVENOUS | Status: AC
  Filled 2022-02-15: qty 10

## 2022-02-15 MED ORDER — MIDAZOLAM HCL 2 MG/2ML IJ SOLN
INTRAMUSCULAR | Status: AC
  Filled 2022-02-15: qty 2

## 2022-02-15 MED ORDER — FENTANYL CITRATE (PF) 100 MCG/2ML IJ SOLN
INTRAMUSCULAR | Status: AC
  Filled 2022-02-15: qty 2

## 2022-02-15 MED ORDER — ORAL CARE MOUTH RINSE: 15.0000 mL | Freq: Once | OROMUCOSAL | Status: AC

## 2022-02-15 MED ORDER — AMOXICILLIN-POT CLAVULANATE 875-125 MG PO TABS: 1.0000 | ORAL_TABLET | Freq: Two times a day (BID) | ORAL | 0 refills | Status: DC

## 2022-02-15 MED ORDER — MIDAZOLAM HCL 2 MG/2ML IJ SOLN
INTRAMUSCULAR | Status: DC | PRN
Start: 2022-02-15 — End: 2022-02-15
  Administered 2022-02-15: 2 mg via INTRAVENOUS

## 2022-02-15 MED ORDER — DEXAMETHASONE SODIUM PHOSPHATE 10 MG/ML IJ SOLN
INTRAMUSCULAR | Status: DC | PRN
Start: 2022-02-15 — End: 2022-02-15
  Administered 2022-02-15: 10 mg via INTRAVENOUS

## 2022-02-15 MED ORDER — AMOXICILLIN-POT CLAVULANATE 875-125 MG PO TABS: 1.0000 | ORAL_TABLET | Freq: Two times a day (BID) | ORAL | 0 refills | Status: AC

## 2022-02-15 MED ORDER — DEXMEDETOMIDINE (PRECEDEX) IN NS 20 MCG/5ML (4 MCG/ML) IV SYRINGE
PREFILLED_SYRINGE | INTRAVENOUS | Status: DC | PRN
  Administered 2022-02-15: 8 ug via INTRAVENOUS
  Administered 2022-02-15: 12 ug via INTRAVENOUS

## 2022-02-15 MED ORDER — OXYCODONE HCL 5 MG PO TABS
ORAL_TABLET | ORAL | Status: AC
  Filled 2022-02-15: qty 1

## 2022-02-15 MED ORDER — OXYCODONE HCL 5 MG/5ML PO SOLN: 5.0000 mg | Freq: Once | ORAL | Status: AC | PRN

## 2022-02-15 MED ORDER — ONDANSETRON HCL 4 MG/2ML IJ SOLN: 4.0000 mg | Freq: Once | INTRAMUSCULAR | Status: DC | PRN

## 2022-02-15 MED ORDER — CEFAZOLIN SODIUM-DEXTROSE 2-4 GM/100ML-% IV SOLN
2.0000 g | INTRAVENOUS | Status: AC
  Administered 2022-02-15: 2 g via INTRAVENOUS
  Filled 2022-02-15: qty 100

## 2022-02-15 MED ORDER — ONDANSETRON HCL 4 MG/2ML IJ SOLN
INTRAMUSCULAR | Status: DC | PRN
  Administered 2022-02-15: 4 mg via INTRAVENOUS

## 2022-02-15 MED ORDER — GABAPENTIN 300 MG PO CAPS: 300.0000 mg | ORAL_CAPSULE | Freq: Three times a day (TID) | ORAL | 0 refills | Status: AC | PRN

## 2022-02-15 MED ORDER — PROPOFOL 10 MG/ML IV BOLUS
INTRAVENOUS | Status: DC | PRN
  Administered 2022-02-15: 150 mg via INTRAVENOUS

## 2022-02-15 MED ORDER — LIDOCAINE 2% (20 MG/ML) 5 ML SYRINGE
INTRAMUSCULAR | Status: DC | PRN
  Administered 2022-02-15: 80 mg via INTRAVENOUS

## 2022-02-15 MED ORDER — OXYCODONE-ACETAMINOPHEN 5-325 MG PO TABS: 1.0000 | ORAL_TABLET | Freq: Four times a day (QID) | ORAL | 0 refills | Status: AC | PRN

## 2022-02-15 MED ORDER — FENTANYL CITRATE (PF) 250 MCG/5ML IJ SOLN
INTRAMUSCULAR | Status: DC | PRN
  Administered 2022-02-15 (×3): 50 ug via INTRAVENOUS
  Administered 2022-02-15: 100 ug via INTRAVENOUS

## 2022-02-15 MED ORDER — VANCOMYCIN HCL 500 MG IV SOLR
INTRAVENOUS | Status: DC | PRN
  Administered 2022-02-15: 500 mg

## 2022-02-15 MED ORDER — ACETAMINOPHEN 500 MG PO TABS
1000.0000 mg | ORAL_TABLET | Freq: Once | ORAL | Status: DC
  Filled 2022-02-15: qty 2

## 2022-02-15 MED ORDER — AMISULPRIDE (ANTIEMETIC) 5 MG/2ML IV SOLN: 10.0000 mg | Freq: Once | INTRAVENOUS | Status: DC | PRN

## 2022-02-15 SURGICAL SUPPLY — 32 items
COVER SURGICAL LIGHT HANDLE (MISCELLANEOUS) ×3 IMPLANT
DRESSING PEEL AND PLC PRVNA 13 (GAUZE/BANDAGES/DRESSINGS) IMPLANT
DRSG EMULSION OIL 3X3 NADH (GAUZE/BANDAGES/DRESSINGS) ×3 IMPLANT
DRSG PEEL AND PLACE PREVENA 13 (GAUZE/BANDAGES/DRESSINGS) ×6
GAUZE XEROFORM 5X9 LF (GAUZE/BANDAGES/DRESSINGS) ×3 IMPLANT
GLOVE BIO SURGEON STRL SZ 6.5 (GLOVE) ×3 IMPLANT
GLOVE BIOGEL PI IND STRL 6.5 (GLOVE) ×2 IMPLANT
GLOVE BIOGEL PI IND STRL 8.5 (GLOVE) ×2 IMPLANT
GLOVE BIOGEL PI INDICATOR 6.5 (GLOVE) ×1
GLOVE BIOGEL PI INDICATOR 8.5 (GLOVE) ×1
GLOVE SS BIOGEL STRL SZ 8.5 (GLOVE) ×2 IMPLANT
GLOVE SUPERSENSE BIOGEL SZ 8.5 (GLOVE) ×1
GOWN STRL REUS W/TWL 2XL LVL3 (GOWN DISPOSABLE) ×6 IMPLANT
HANDPIECE INTERPULSE COAX TIP (DISPOSABLE) ×3
IMMOBILIZER KNEE 22 UNIV (SOFTGOODS) ×1 IMPLANT
IMMOBILIZER KNEE 24 THIGH 36 (MISCELLANEOUS) IMPLANT
IMMOBILIZER KNEE 24 UNIV (MISCELLANEOUS) ×3
IV NS IRRIG 3000ML ARTHROMATIC (IV SOLUTION) ×2 IMPLANT
KIT BASIN OR (CUSTOM PROCEDURE TRAY) ×3 IMPLANT
KIT PREVENA INCISION MGT 13 (CANNISTER) ×1 IMPLANT
KIT TURNOVER KIT B (KITS) ×3 IMPLANT
MANIFOLD NEPTUNE II (INSTRUMENTS) ×3 IMPLANT
NDL HYPO 25GX1X1/2 BEV (NEEDLE) ×2 IMPLANT
NEEDLE HYPO 25GX1X1/2 BEV (NEEDLE) ×3 IMPLANT
PACK ORTHO EXTREMITY (CUSTOM PROCEDURE TRAY) ×3 IMPLANT
PAD ARMBOARD 7.5X6 YLW CONV (MISCELLANEOUS) ×6 IMPLANT
SET HNDPC FAN SPRY TIP SCT (DISPOSABLE) IMPLANT
SUT PDS AB 1 CT1 36 (SUTURE) ×1 IMPLANT
TOWEL GREEN STERILE (TOWEL DISPOSABLE) ×3 IMPLANT
TOWEL GREEN STERILE FF (TOWEL DISPOSABLE) ×3 IMPLANT
UNDERPAD 30X36 HEAVY ABSORB (UNDERPADS AND DIAPERS) ×3 IMPLANT
WATER STERILE IRR 1000ML POUR (IV SOLUTION) ×2 IMPLANT

## 2022-02-15 NOTE — H&P (Signed)
Chief Complaint: Left prepatellar bursitis status post I&D  History: 51 year old gentleman status post formal I&D of left prepatellar bursitis.  Patient presents today for repeat wound irrigation and debridement and possible wound closure.  Past Medical History:  Diagnosis Date   Arthritis    hands    GERD (gastroesophageal reflux disease)    percocet causes reflux issues     Allergies  Allergen Reactions   Norco [Hydrocodone-Acetaminophen] Nausea And Vomiting    Severe     No current facility-administered medications on file prior to encounter.   Current Outpatient Medications on File Prior to Encounter  Medication Sig Dispense Refill   acetaminophen (TYLENOL) 500 MG tablet Take 1,000 mg by mouth as needed.     amphetamine-dextroamphetamine (ADDERALL) 20 MG tablet Take 20 mg by mouth daily as needed (ADD).     cephALEXin (KEFLEX) 500 MG capsule Take 1 capsule (500 mg total) by mouth 4 (four) times daily for 14 days. 56 capsule 0   doxycycline (VIBRAMYCIN) 50 MG capsule Take 2 capsules (100 mg total) by mouth 2 (two) times daily for 14 days. 56 capsule 0    Physical Exam: Vitals:   02/15/22 0552  BP: (!) 131/96  Pulse: 64  Resp: 17  Temp: 97.6 F (36.4 C)  SpO2: 97%   Body mass index is 25.1 kg/m. Alert and oriented x3.  No fevers or chills. No shortness of breath or chest pain Abdomen soft and nontender.  No loss of bowel or bladder control. Lower extremity wound VAC: Sitting properly.  No significant erythema or drainage noted Neurovascularly intact with no focal motor or sensory deficits in the lower extremity.  Image: MR KNEE LEFT WO CONTRAST  Result Date: 02/09/2022 CLINICAL DATA:  History of recurrent left knee bursitis. Soft tissue infection suspected, knee, xray done EXAM: MRI OF THE LEFT KNEE WITHOUT CONTRAST TECHNIQUE: Multiplanar, multisequence MR imaging of the knee was performed. No intravenous contrast was administered. COMPARISON:  None  Available. FINDINGS: MENISCI Medial meniscus:  Intact. Lateral meniscus:  Intact. LIGAMENTS Cruciates: Intact ACL with mild mucoid degeneration.  Intact PCL. Collaterals: Intact MCL. Lateral collateral ligament complex intact. CARTILAGE Patellofemoral:  No chondral defect. Medial:  No chondral defect. Lateral:  No chondral defect. MISCELLANEOUS Joint: No joint effusion. Trace fluid along the deep margin of the mid to distal patellar tendon. No significant deep infrapatellar bursal fluid collection. No fat pad edema. Popliteal Fossa:  No Baker's cyst. Intact popliteus tendon. Extensor Mechanism:  Intact quadriceps and patellar tendons. Bones: No acute fracture. No dislocation. No bone marrow edema. No erosion. No suspicious marrow replacing bone lesion. 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 Other: Complex fluid collection within the infrapatellar soft tissues measuring 4.5 x 1.7 x 4.3 cm. Surrounding prepatellar edema. Normal muscle bulk and signal intensity. IMPRESSION: 1. Complex fluid collection within the infrapatellar soft tissues measuring 4.5 x 1.7 x 4.3 cm with surrounding prepatellar edema. Given the history, findings favored to represent septic prepatellar bursitis versus abscess. Hematoma could have a similar appearance in the appropriate clinical setting. 2. No evidence to suggest septic arthritis. No evidence of osteomyelitis. 3. No internal derangement of the left knee. Electronically Signed   By: Duanne Guess D.O.   On: 02/09/2022 17:11    A/P: [name] is a 51 year old gentleman who was diagnosed with a left prepatellar bursitis and subsequent underwent a formal I&D 6 days ago.  Patient has had a wound VAC in place and  presents today for repeat washout and possible wound closure.  I have gone over the risks, benefits, and alternatives to surgery with the patient in great detail.  All of his questions were addressed.

## 2022-02-15 NOTE — Transfer of Care (Signed)
Immediate Anesthesia Transfer of Care Note  Patient: [name]  Procedure(s) Performed: Repeat irrigation and debridement left knee (Left: Knee) wound closure with incisional  vac application (Left)  Patient Location: PACU  Anesthesia Type:General  Level of Consciousness: awake, alert  and oriented  Airway & Oxygen Therapy: Patient Spontanous Breathing  Post-op Assessment: Report given to RN  Post vital signs: Reviewed and stable  Last Vitals:  Vitals Value Taken Time  BP    Temp    Pulse    Resp    SpO2      Last Pain:  Vitals:   02/15/22 0632  TempSrc:   PainSc: 5       Patients Stated Pain Goal: 2 ([id number])  Complications: No notable events documented.

## 2022-02-15 NOTE — Progress Notes (Signed)
Virtual Visit via Telephone Note  I connected with [name] on 02/15/22 at  2:00 PM EDT by telephone and verified that I am speaking with the correct person using two identifiers.  Location: Patient:  Home Provider: [facility]   Reason for infectious disease consult: Infected prepatellar bursitis  Requesting physician: Melina Schools, MD  I discussed the limitations, risks, security and privacy concerns of performing an evaluation and management service by telephone and the availability of in person appointments. I also discussed with the patient that there may be a patient responsible charge related to this service. The patient expressed understanding and agreed to proceed.   History of Present Illness:  [name] is a 51 year old Caucasian man who has a history of gastroesophageal reflux disease and osteoarthritis who currently has had a history of recurrent bursitis of the left knee.  He told me that he had had an area on his left knee, but that had become purulent in which she was able to drain himself at home.  However he had worsening progression of pain and swelling in the left anterior knee x1 weeek.  He had been seen by PCP who prescribed doxycycline without any improvement.  Then came to the emergency department [facility] on Feb 09, 2022.  He was evaluated by Dr. Melina Schools who took him to the operating room and performed I&D of prepatellar septic bursitis.  Wound vacuum was applied as well.  Cultures yielded Streptococcus sanguinous as well as Prevotella been via which was beta-lactamase positive and Bacteroides fragilis from 1 culture and from another culture, Streptococcus anginosus and Bacteroides fragilis that was beta-lactamase positive was isolated.   The patient was brought back to the operating room for repeat I&D today and Dr. Rolena Infante had consulted with Dr. Juleen China my partner to see the patient while he was in the operating room today but by the time Dr.  Oneal Grout arrived the patient had already left which was only 45 minutes after he received the consult.  The patient had been scheduled to see me as an outpatient at 2 PM today but this was converted to a virtual appointment as he had already made his way back to [facility] and wanted to do the consult over the telephone.  Juleen China had already called in Augmentin for the patient (he had previously been on Keflex and doxycycline prior to cultures coming back) the patient has a 2-week supply in hand.  Patient wants to only follow-up with Dr. Rolena Infante due to him living in [facility].  I also offered for him to come and see [place] potentially the same day that he is seeing Dr. Rolena Infante but he feels like this is a lot of trouble.  Therefore I will allow him to follow-up with Dr. Rolena Infante and if Dr. Rolena Infante would like [place] to see the patient for follow-up we can certainly arrange that I want to give him another 2 weeks of Augmentin to have on hand to make sure they get through at least 3 weeks and if necessary 4.  ROS as above otherwise negative   Past Medical History:  Diagnosis Date   Arthritis    hands    GERD (gastroesophageal reflux disease)    percocet causes reflux issues    Septic prepatellar bursitis 02/15/2022    Past Surgical History:  Procedure Laterality Date   HERNIA REPAIR     INGUINAL HERNIA REPAIR  08/06/2011   Procedure: HERNIA REPAIR INGUINAL ADULT;  Surgeon: Imogene Burn. Tsuei,  MD;  Location: [facility];  Service: General;  Laterality: Right;   IRRIGATION AND DEBRIDEMENT KNEE Left 02/09/2022   Procedure: IRRIGATION AND DEBRIDEMENT KNEE;  Surgeon: Melina Schools, MD;  Location: [facility];  Service: Orthopedics;  Laterality: Left;    Family History  Problem Relation Age of Onset   Cancer Paternal Aunt    Prostate cancer Neg Hx    Bladder Cancer Neg Hx    Kidney cancer Neg Hx       Social History   Socioeconomic History   Marital status: Married    Spouse name: Not on  file   Number of children: Not on file   Years of education: Not on file   Highest education level: Not on file  Occupational History   Not on file  Tobacco Use   Smoking status: Former    Packs/day: 1.00    Years: 20.00    Pack years: 20.00    Types: Cigarettes   Smokeless tobacco: Former  Scientific laboratory technician Use: Some days  Substance and Sexual Activity   Alcohol use: No   Drug use: No   Sexual activity: Not on file  Other Topics Concern   Not on file  Social History Narrative   Not on file   Social Determinants of Health   Financial Resource Strain: Not on file  Food Insecurity: Not on file  Transportation Needs: Not on file  Physical Activity: Not on file  Stress: Not on file  Social Connections: Not on file    Allergies  Allergen Reactions   Norco [Hydrocodone-Acetaminophen] Nausea And Vomiting    Severe     No current facility-administered medications for this visit.  Current Outpatient Medications:    amoxicillin-clavulanate (AUGMENTIN) 875-125 MG tablet, Take 1 tablet by mouth 2 (two) times daily for 14 days., Disp: 28 tablet, Rfl: 0   gabapentin (NEURONTIN) 300 MG capsule, Take 1 capsule (300 mg total) by mouth 3 (three) times daily as needed for up to 14 days., Disp: 42 capsule, Rfl: 0   oxyCODONE-acetaminophen (PERCOCET) 5-325 MG tablet, Take 1 tablet by mouth every 6 (six) hours as needed for up to 5 days for severe pain., Disp: 20 tablet, Rfl: 0  Facility-Administered Medications Ordered in Other Visits:    acetaminophen (TYLENOL) tablet 1,000 mg, 1,000 mg, Oral, Once, Merlinda Frederick, MD   amisulpride (BARHEMSYS) injection 10 mg, 10 mg, Intravenous, Once PRN, Merlinda Frederick, MD   fentaNYL (SUBLIMAZE) 100 MCG/2ML injection, , , ,    fentaNYL (SUBLIMAZE) injection 25-50 mcg, 25-50 mcg, Intravenous, Q5 min PRN, Merlinda Frederick, MD, 50 mcg at 02/15/22 0827   lactated ringers infusion, , Intravenous, Continuous, Merlinda Frederick, MD, New Bag at 02/15/22 0723    ondansetron [facility]) injection 4 mg, 4 mg, Intravenous, Once PRN, Merlinda Frederick, MD   oxyCODONE (Oxy IR/ROXICODONE) 5 MG immediate release tablet, , , ,     Observations/Objective:  He sounded relatively comfortable at home postoperatively.  Assessment and Plan:  Left prepatellar septic bursitis with streptococcal species and anaerobes isolated on culture:  He is to continue his Augmentin and get through at least 3 weeks of treatment.  He is to follow-up closely with Dr. Rolena Infante and as I have mentioned we be happy to see him in person as well the patient prefers to only follow-up with Dr. Rolena Infante at this point in time.    Follow Up Instructions:    I discussed the assessment and treatment  plan with the patient. The patient was provided an opportunity to ask questions and all were answered. The patient agreed with the plan and demonstrated an understanding of the instructions.   The patient was advised to call back or seek an in-person evaluation if the symptoms worsen or if the condition fails to improve as anticipated.  I provided 24 minutes of non-face-to-face time during this encounter.   Alcide Evener, MD

## 2022-02-15 NOTE — Anesthesia Procedure Notes (Signed)
Procedure Name: LMA Insertion Date/Time: 02/15/2022 7:31 AM Performed by: Darryl Nestle, CRNA Pre-anesthesia Checklist: Patient identified, Emergency Drugs available, Suction available and Patient being monitored Patient Re-evaluated:Patient Re-evaluated prior to induction Oxygen Delivery Method: Circle system utilized Preoxygenation: Pre-oxygenation with 100% oxygen Induction Type: IV induction LMA: LMA inserted LMA Size: 4.0 Tube type: Oral Number of attempts: 1 Placement Confirmation: positive ETCO2 and breath sounds checked- equal and bilateral Tube secured with: Tape Dental Injury: Teeth and Oropharynx as per pre-operative assessment

## 2022-02-15 NOTE — Anesthesia Postprocedure Evaluation (Signed)
Anesthesia Post Note  Patient: ISSAIH KAUS  Procedure(s) Performed: Repeat irrigation and debridement left knee (Left: Knee) wound closure with incisional  vac application (Left)     Patient location during evaluation: PACU Anesthesia Type: General Level of consciousness: awake Pain management: pain level controlled Vital Signs Assessment: post-procedure vital signs reviewed and stable Respiratory status: spontaneous breathing and respiratory function stable Cardiovascular status: stable Postop Assessment: no apparent nausea or vomiting Anesthetic complications: no   No notable events documented.  Last Vitals:  Vitals:   02/15/22 0813 02/15/22 0830  BP: 134/89 (!) 142/75  Pulse: 68 65  Resp: 14 (!) 27  Temp: 36.8 C   SpO2: 99% 99%    Last Pain:  Vitals:   02/15/22 0830  TempSrc:   PainSc: 4                  Candra R Phillip Sandler

## 2022-02-15 NOTE — Op Note (Signed)
OPERATIVE REPORT  DATE OF SURGERY: 02/15/2022  PATIENT NAME:  [name] MRN: [id number] DOB: 01/07/1971  PCP: Kaleen Mask, MD  PRE-OPERATIVE DIAGNOSIS: Left prepatellar bursitis.  Status post I&D and wound VAC application  POST-OPERATIVE DIAGNOSIS: Same  PROCEDURE:   Repeat I&D of left prepatellar bursitis with wound closure and application of incisional VAC  SURGEON:  Venita Lick, MD  PHYSICIAN ASSISTANT: None  ANESTHESIA:   General  EBL: None  Complications: None  BRIEF HISTORY: [name] is a 51 y.o. male who had a infected total bursitis.  Patient had a formal I&D and insertion of a wound VAC approximately 6 days ago.  Patient presents today for repeat I&D and wound closure with application of incisional VAC.  PROCEDURE DETAILS: Patient was brought into the operating room and was properly positioned on the operating room table.  After induction with general anesthesia the patient was intubated.  A timeout was taken to confirm all important data: including patient, procedure, and the level. Teds, SCD's were applied.   The wound VAC was removed and the left lower extremity was prepped and draped in standard fashion.  There was no erythema or purulent drainage noted.  The wound edges were clean and dry and there was no signs of active infection.  The wound was then irrigated with 6 L of normal saline.  I then freshened the wound edges with a 15 blade scalpel and then placed vancomycin powder into the wound.  I then closed the skin with a running vertical mattress suture.  I utilized a #1 PDS stitch.  An incisional VAC was then applied over this and then I placed a knee immobilizer.  Patient was then extubated and transferred the PACU without incident.  End of the case all needle sponge counts were correct.  There were no adverse intraoperative events.  Venita Lick, MD 02/15/2022 8:13 AM

## 2022-02-15 NOTE — Op Note (Signed)
02/15/2022  8:11 AM  PATIENT:  [name]  51 y.o. male  PRE-OPERATIVE DIAGNOSIS:  Infected left pre patella bursitis  POST-OPERATIVE DIAGNOSIS:  Infected left pre patella bursitis  PROCEDURE:  Procedure(s) with comments: Repeat irrigation and debridement left knee (Left) - 1 hr wound closure with incisional  vac application (Left)  SURGEON:  Surgeon(s) and Role:    Venita Lick, MD - Primary  PHYSICIAN ASSISTANT:   ASSISTANTS: none   ANESTHESIA:   general  EBL:  none   BLOOD ADMINISTERED:none  DRAINS:  incisional privena vac    LOCAL MEDICATIONS USED:  NONE  SPECIMEN:  No Specimen  DISPOSITION OF SPECIMEN:  N/A  COUNTS:  YES  TOURNIQUET:  * No tourniquets in log *  DICTATION: .Dragon Dictation  PLAN OF CARE: Discharge to home after PACU  PATIENT DISPOSITION:  PACU - hemodynamically stable.

## 2022-02-15 NOTE — Progress Notes (Signed)
I was made aware of this consult at approximately 9am.  Went to see patient at 9:45 because orthopedics wanted him to go home the same day.  Patient had left because he did not want to wait any longer.  He has an appointment with Dr Daiva Eves today at 2pm at [facility].  He had repeat I&D of left prepatellar bursitis with wound closure and application of incisional VAC today after initial I&D of left patella bursitis on 5/19.  OR cultures at that time grew Strep sanguinis, prevotella, and B fragilis.  Another culture grew Strep anginosis and B fragilis.  He was being treated with Doxy & Keflex.  I tried to call patient and it went straight to voicemail for somebody named Elijah.  I was trying to let him know that we sent in Augmentin to his pharmacy and to stop the Doxy/Keflex.   Vedia Coffer for Infectious Disease [facility] 02/15/2022, 10:16 AM   I spent > 5 min coordinating plan, discussing with team.

## 2022-02-16 ENCOUNTER — Encounter (HOSPITAL_COMMUNITY): Payer: Self-pay | Admitting: Orthopedic Surgery

## 2022-02-17 ENCOUNTER — Emergency Department (HOSPITAL_COMMUNITY)
Admission: EM | Admit: 2022-02-17 | Discharge: 2022-02-17 | Disposition: A | Discharge status: Home / Self Care | Payer: No Typology Code available for payment source | Attending: Emergency Medicine | Admitting: Emergency Medicine

## 2022-02-17 ENCOUNTER — Other Ambulatory Visit: Payer: Self-pay

## 2022-02-17 ENCOUNTER — Encounter (HOSPITAL_COMMUNITY): Payer: Self-pay | Admitting: Emergency Medicine

## 2022-02-17 DIAGNOSIS — Z48817 Encounter for surgical aftercare following surgery on the skin and subcutaneous tissue: Secondary | ICD-10-CM | POA: Diagnosis present

## 2022-02-17 DIAGNOSIS — Z4689 Encounter for fitting and adjustment of other specified devices: Secondary | ICD-10-CM

## 2022-02-17 NOTE — Care Management (Addendum)
Patient wound vac was only set for 7 days. ED MD called and spoke to Ortho on call. [employer] Tour manager. OR called to see about a replacement. They stated to call portable , portable paged [phone] . Will replace wound vac for another 7 days Patient is following up with Dr. Shon Baton and ID.  1630 Spoke to the patient, it is a Proveena not wound vac. Called OR back they are sending one down now.  Patient generally upset about issues, verbalizes discontent for incompetence. Talked about a Clinical research associate.  Apologized for any issue, swift production of preveena to attach back on to his foam and tubing which is already present. He declined need for home health RN or home health in general or DME 1650 UP to OR to obtain proveena Pack.  1700 Proveena pack received assisted patient with changeover.  Another 7 day proveena on and attached, answerer all questions. Patient did thank this CM. He then stated he had to leave and left. MD notified RN notified.

## 2022-02-17 NOTE — ED Provider Notes (Signed)
[facility] EMERGENCY DEPARTMENT Provider Note   CSN: [id number] Arrival date & time: 02/17/22  1506     History  Chief Complaint  Patient presents with   Post-op Problem    [name] is a 51 y.o. male.  Patient had incision and drainage of prepatellar septic bursitis on his knee by Dr. Shon Baton on May 19 and again on May 25.  He has a wound VAC in place.  He states the wound VAC stopped functioning last night as it was "only set for 7 days and was supposed to be set for 14".  He states the wound VAC is no longer functioning.  He states the wound is otherwise doing well and has not had any significant bleeding or drainage.  No fever.  No abdominal pain, nausea or vomiting.  He is currently taking Augmentin per infectious disease recommendations. He tried to call Dr. Gary Fleet office without success.  He is frustrated and wants the wound VAC removed completely  The history is provided by the patient.      Home Medications Prior to Admission medications   Medication Sig Start Date End Date Taking? Authorizing Provider  acetaminophen (TYLENOL) 500 MG tablet Take 1,000 mg by mouth as needed.    [provider]  amoxicillin-clavulanate (AUGMENTIN) 875-125 MG tablet Take 1 tablet by mouth 2 (two) times daily for 14 days. 02/15/22 03/01/22  Randall Hiss, MD  amphetamine-dextroamphetamine (ADDERALL) 20 MG tablet Take 20 mg by mouth daily as needed (ADD). 01/05/20   [provider]  gabapentin (NEURONTIN) 300 MG capsule Take 1 capsule (300 mg total) by mouth 3 (three) times daily as needed for up to 14 days. 02/15/22 03/01/22  Venita Lick, MD  oxyCODONE-acetaminophen (PERCOCET) 5-325 MG tablet Take 1 tablet by mouth every 6 (six) hours as needed for up to 5 days for severe pain. 02/15/22 02/20/22  Venita Lick, MD      Allergies    Norco [hydrocodone-acetaminophen]    Review of Systems   Review of Systems  Constitutional:  Negative for  activity change, appetite change and fever.  HENT:  Negative for congestion.   Respiratory:  Negative for cough, chest tightness and shortness of breath.   Gastrointestinal:  Negative for abdominal pain, nausea and vomiting.  Genitourinary:  Negative for dysuria and hematuria.  Musculoskeletal:  Positive for arthralgias and myalgias.  Skin:  Positive for wound. Negative for rash.  Neurological:  Negative for dizziness, weakness and headaches.   all other systems are negative except as noted in the HPI and PMH.   Physical Exam Updated Vital Signs BP (!) 153/94   Pulse 84   Temp 98.3 F (36.8 C)   Resp 18   SpO2 95%  Physical Exam Vitals and nursing note reviewed.  Constitutional:      General: He is not in acute distress.    Appearance: He is well-developed.  HENT:     Head: Normocephalic and atraumatic.     Mouth/Throat:     Pharynx: No oropharyngeal exudate.  Eyes:     Conjunctiva/sclera: Conjunctivae normal.     Pupils: Pupils are equal, round, and reactive to light.  Neck:     Comments: No meningismus. Cardiovascular:     Rate and Rhythm: Normal rate and regular rhythm.     Heart sounds: Normal heart sounds. No murmur heard. Pulmonary:     Effort: Pulmonary effort is normal. No respiratory distress.     Breath sounds: Normal  breath sounds.  Abdominal:     Palpations: Abdomen is soft.     Tenderness: There is no abdominal tenderness. There is no guarding or rebound.  Musculoskeletal:        General: No tenderness. Normal range of motion.     Cervical back: Normal range of motion and neck supple.     Comments: Prepatellar wound VAC in place to left anterior knee.  No surrounding erythema or edema.  Reduced range of motion secondary to pain.  Intact DP and PT pulses  Skin:    General: Skin is warm.  Neurological:     Mental Status: He is alert and oriented to person, place, and time.     Cranial Nerves: No cranial nerve deficit.     Motor: No abnormal muscle tone.      Coordination: Coordination normal.     Comments:  5/5 strength throughout. CN 2-12 intact.Equal grip strength.   Psychiatric:        Behavior: Behavior normal.    ED Results / Procedures / Treatments   Labs (all labs ordered are listed, but only abnormal results are displayed) Labs Reviewed  CBC WITH DIFFERENTIAL/PLATELET  BASIC METABOLIC PANEL    EKG None  Radiology No results found.  Procedures Procedures    Medications Ordered in ED Medications - No data to display  ED Course/ Medical Decision Making/ A&P                           Medical Decision Making Amount and/or Complexity of Data Reviewed Labs: ordered.   Patient with wound VAC in place status post incision and drainage of left knee for prepatellar septic bursitis.  He states the wound pump has stopped functioning and he is frustrated and wants the pump removed.  Discussed that we cannot remove the wound VAC in the ED.  We will attempt to contact his surgeon as well as case manager to see if we get him a new wound VAC  D/w Dr. Odis Hollingshead on call for Dr. Shon Baton.  He is familiar with patient.  He states patient should be able to get a new wound VAC from central supply in OR.  If this is not possible recommends removal of dressing and doing wet-to-dry dressings for the next 3 days until he sees Dr. Shon Baton.  Patient refuses blood work.  Case manager Judeth Cornfield was able to get patient new wound VAC from the OR.  Patient eloped after receiving the wound VAC and was not able to be reevaluated. He is supposed to see Dr. Shon Baton on May 30.        Final Clinical Impression(s) / ED Diagnoses Final diagnoses:  Encounter for management of wound VAC    Rx / DC Orders ED Discharge Orders     None         Gaby Harney, Jeannett Senior, MD 02/17/22 2143

## 2022-02-17 NOTE — ED Triage Notes (Signed)
Pt had I&D of L knee on 5/25 and states the wound vac was supposed to be for 14 days and it was only set for 7.  Pt frustrated and wants Dr. Shon Baton called and wants wound vac removed.  Pumped is already removed and turned off.

## 2022-02-17 NOTE — ED Notes (Signed)
Pt is refusing lab work.

## 2023-12-07 IMAGING — MR MR KNEE*L* W/O CM
6 series · 39 of 40 positions shown · non-contrast
Comparison: None Available.

CLINICAL DATA: History of recurrent left knee bursitis. Soft tissue
infection suspected, knee, xray done

EXAM:
MRI OF THE LEFT KNEE WITHOUT CONTRAST
TECHNIQUE: Multiplanar, multisequence MR imaging of the knee was performed. No
intravenous contrast was administered.

[Series 5: T2 fat-sat · axial · left · 4.0mm · 0.50mm/px · z∈[-53,+86]mm · 9 of 33 slices shown (1 of 3)]
[im 1/33]
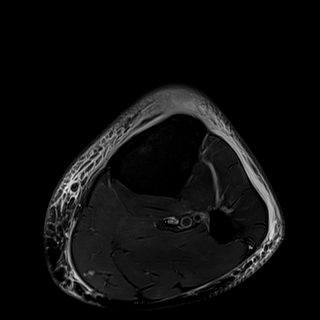
[im 5/33]
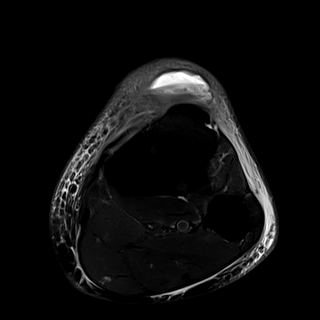
[im 9/33]
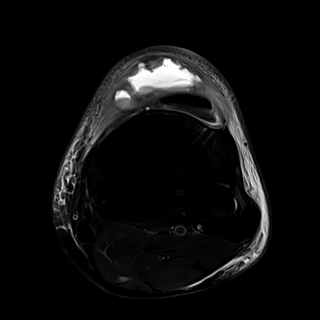
[im 13/33]
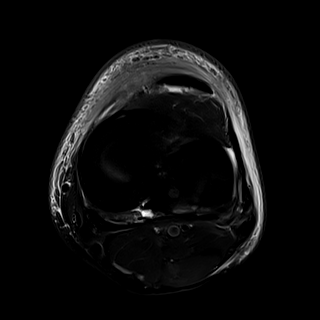
[im 17/33]
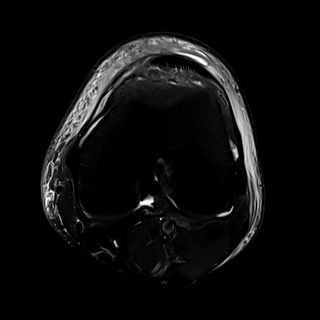
[im 21/33]
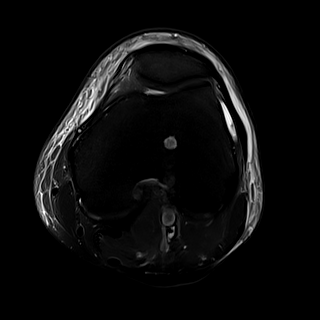
[im 25/33]
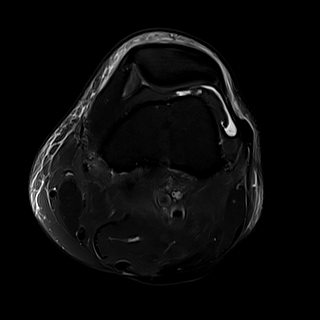
[im 29/33]
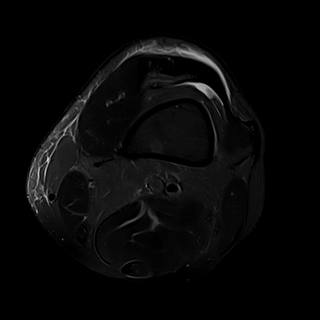
[im 33/33]
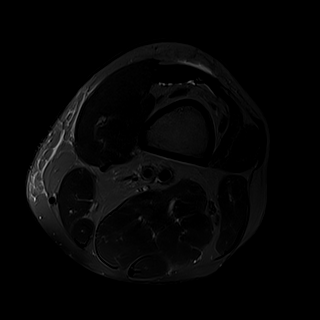

[Series 6: T1 · coronal · left · 4.0mm · 0.29mm/px · 5 of 25 slices shown]
[im 1/25]
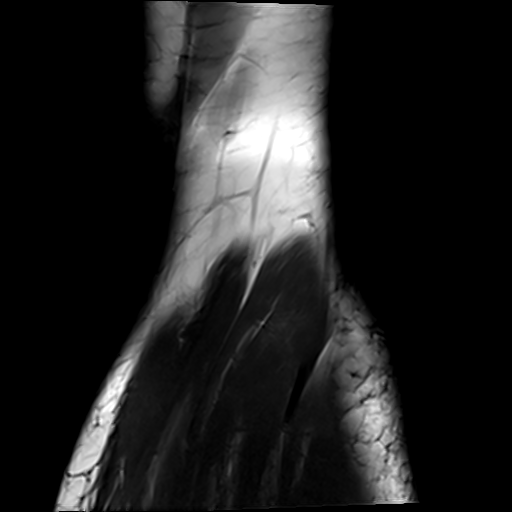
[im 5/25]
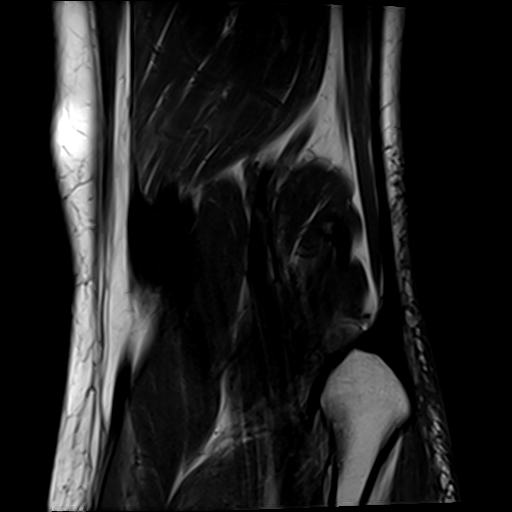
[im 10/25]
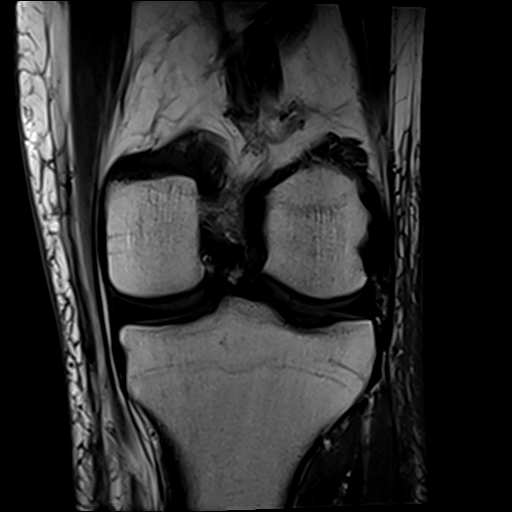
[im 15/25]
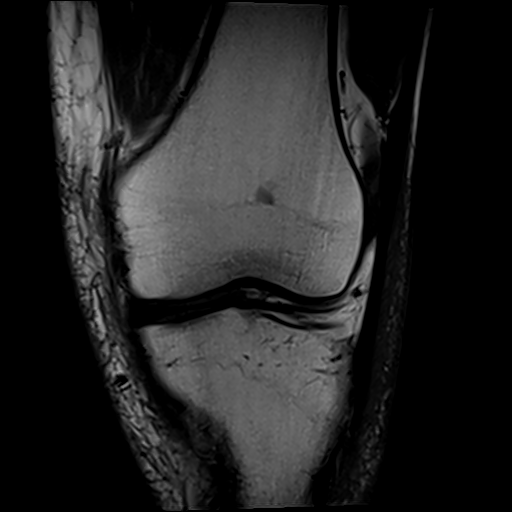
[im 20/25]
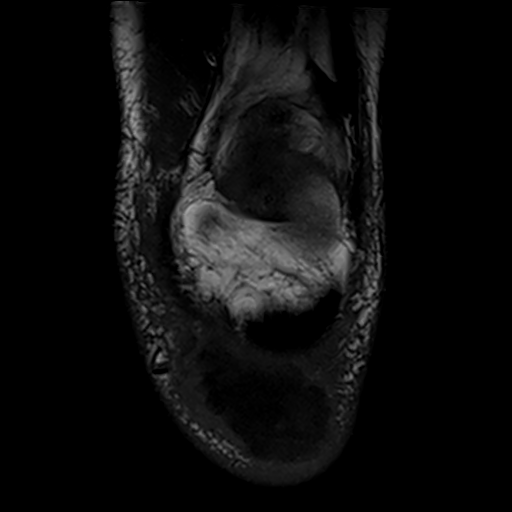

[Series 7: T2 fat-sat · coronal · left · 4.0mm · 0.59mm/px · 6 of 25 slices shown (2 of 3)]
[im 1/25]
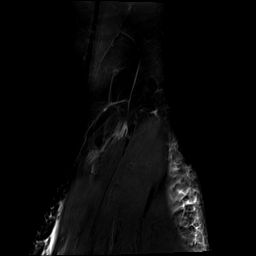
[im 5/25]
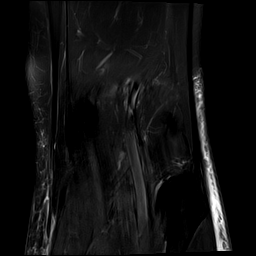
[im 10/25]
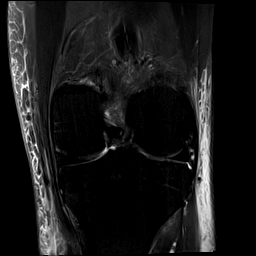
[im 15/25]
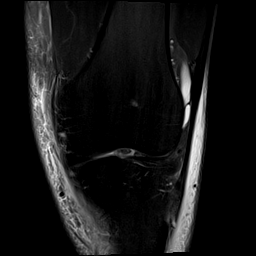
[im 20/25]
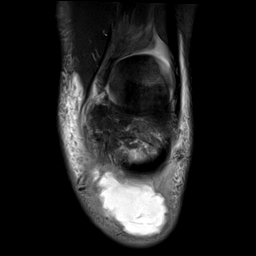
[im 25/25]
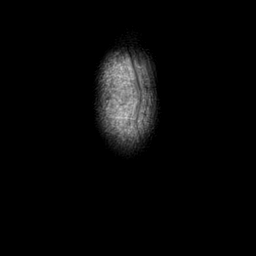

[Series 8: PD fat-sat · coronal · left · 3.0mm · 0.47mm/px · 9 of 35 slices shown (1 of 2)]
[im 1/35]
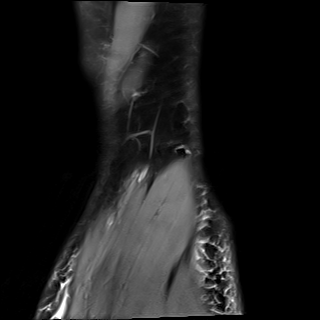
[im 5/35]
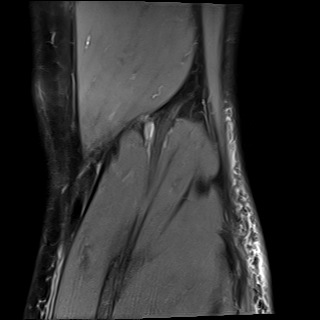
[im 9/35]
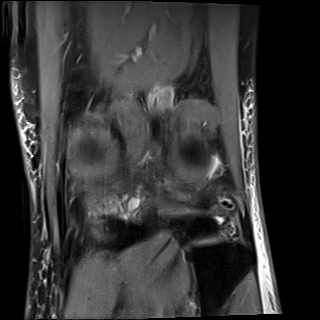
[im 13/35]
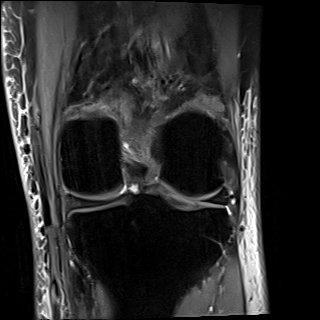
[im 18/35]
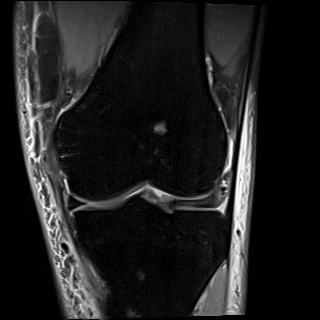
[im 22/35]
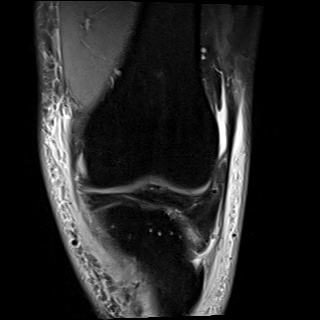
[im 26/35]
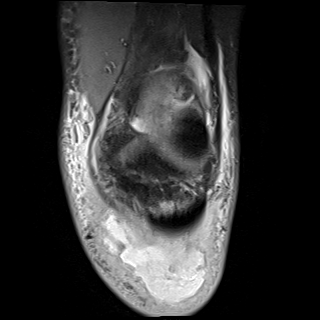
[im 30/35]
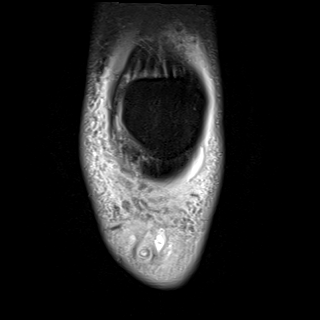
[im 35/35]
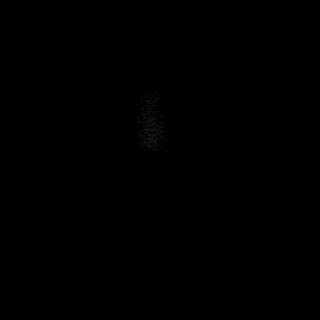

[Series 9: PD fat-sat · sagittal · left · 4.0mm · 0.47mm/px · 5 of 22 slices shown (2 of 2)]
[im 1/22]
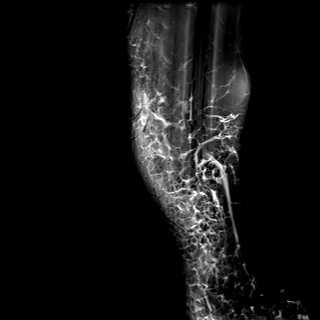
[im 6/22]
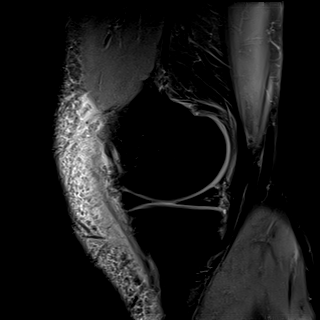
[im 11/22]
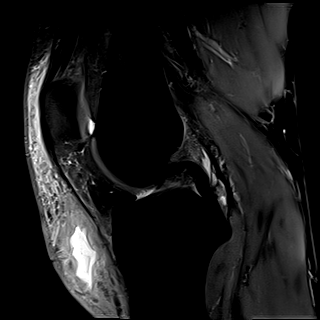
[im 16/22]
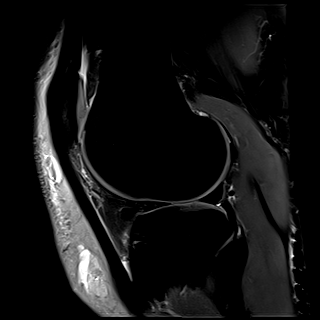
[im 22/22]
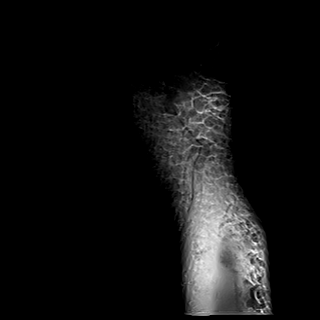

[Series 10: T2 fat-sat · sagittal · left · 4.0mm · 0.47mm/px · 5 of 22 slices shown (3 of 3)]
[im 1/22]
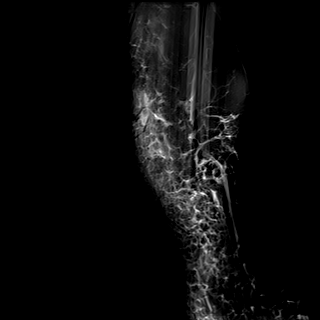
[im 6/22]
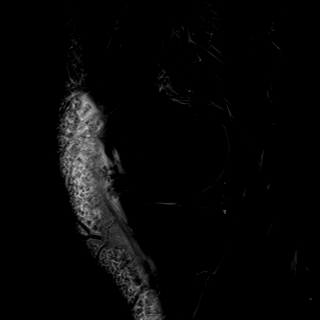
[im 11/22]
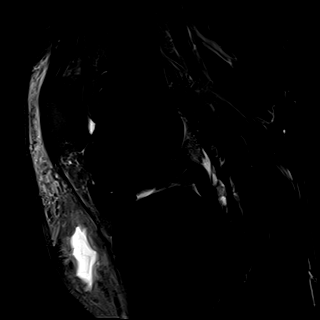
[im 16/22]
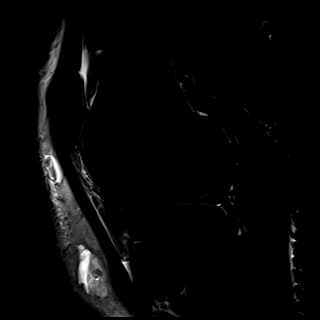
[im 22/22]
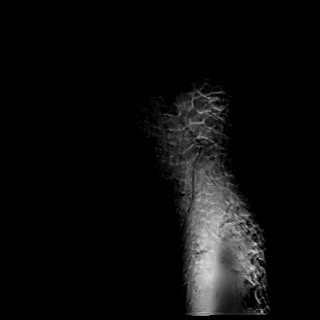

[39 of 40 positions shown; findings below may reference images not displayed]

FINDINGS: MENISCI

Medial meniscus:  Intact.

Lateral meniscus:  Intact.

LIGAMENTS

Cruciates: Intact ACL with mild mucoid degeneration.  Intact PCL.

Collaterals: Intact MCL. Lateral collateral ligament complex intact.

CARTILAGE

Patellofemoral:  No chondral defect.

Medial:  No chondral defect.

Lateral:  No chondral defect.

MISCELLANEOUS

Joint: No joint effusion. Trace fluid along the deep margin of the
mid to distal patellar tendon. No significant deep infrapatellar
bursal fluid collection. No fat pad edema.

Popliteal Fossa:  No Baker's cyst. Intact popliteus tendon.

Extensor Mechanism:  Intact quadriceps and patellar tendons.

Bones: No acute fracture. No dislocation. No bone marrow edema. No
erosion. No suspicious marrow replacing bone lesion. Subcentimeter
probable enchondroma within the distal femoral metaphysis centrally,
no concerning features.

Other: Complex fluid collection within the infrapatellar soft
tissues measuring 4.5 x 1.7 x 4.3 cm. Surrounding prepatellar edema.
Normal muscle bulk and signal intensity.
IMPRESSION: 1. Complex fluid collection within the infrapatellar soft tissues
measuring 4.5 x 1.7 x 4.3 cm with surrounding prepatellar edema.
Given the history, findings favored to represent septic prepatellar
bursitis versus abscess. Hematoma could have a similar appearance in
the appropriate clinical setting.
2. No evidence to suggest septic arthritis. No evidence of
osteomyelitis.
3. No internal derangement of the left knee.
